# Patient Record
Sex: Female | Born: 1935
Health system: Southern US, Community
[De-identification: ages and names within clinical notes are randomized; demographics above are authoritative.]

## PROBLEM LIST (undated history)

## (undated) DIAGNOSIS — Z8709 Personal history of other diseases of the respiratory system: Secondary | ICD-10-CM

## (undated) DIAGNOSIS — J189 Pneumonia, unspecified organism: Secondary | ICD-10-CM

## (undated) DIAGNOSIS — Z973 Presence of spectacles and contact lenses: Secondary | ICD-10-CM

## (undated) DIAGNOSIS — K219 Gastro-esophageal reflux disease without esophagitis: Secondary | ICD-10-CM

## (undated) DIAGNOSIS — K5900 Constipation, unspecified: Secondary | ICD-10-CM

## (undated) DIAGNOSIS — M1990 Unspecified osteoarthritis, unspecified site: Secondary | ICD-10-CM

## (undated) DIAGNOSIS — Z9289 Personal history of other medical treatment: Secondary | ICD-10-CM

## (undated) DIAGNOSIS — Z87442 Personal history of urinary calculi: Secondary | ICD-10-CM

## (undated) DIAGNOSIS — I10 Essential (primary) hypertension: Secondary | ICD-10-CM

## (undated) DIAGNOSIS — K56609 Unspecified intestinal obstruction, unspecified as to partial versus complete obstruction: Secondary | ICD-10-CM

## (undated) DIAGNOSIS — Z8719 Personal history of other diseases of the digestive system: Secondary | ICD-10-CM

## (undated) HISTORY — PX: ABDOMINAL HYSTERECTOMY: SHX81

## (undated) HISTORY — PX: EYE SURGERY: SHX253

## (undated) HISTORY — PX: APPENDECTOMY: SHX54

## (undated) HISTORY — PX: BREAST SURGERY: SHX581

## (undated) HISTORY — PX: OOPHORECTOMY: SHX86

## (undated) HISTORY — PX: ABDOMINAL SURGERY: SHX537

---

## 1998-01-29 ENCOUNTER — Other Ambulatory Visit: Admission: RE | Admit: 1998-01-29 | Discharge: 1998-01-29 | Payer: Self-pay | Admitting: Obstetrics and Gynecology

## 1998-11-22 ENCOUNTER — Encounter: Payer: Self-pay | Admitting: Family Medicine

## 1998-11-22 ENCOUNTER — Ambulatory Visit (HOSPITAL_COMMUNITY): Admission: RE | Admit: 1998-11-22 | Discharge: 1998-11-22 | Payer: Self-pay | Admitting: Family Medicine

## 1998-11-30 ENCOUNTER — Encounter: Admission: RE | Admit: 1998-11-30 | Discharge: 1998-12-19 | Payer: Self-pay | Admitting: Family Medicine

## 2001-03-26 ENCOUNTER — Other Ambulatory Visit: Admission: RE | Admit: 2001-03-26 | Discharge: 2001-03-26 | Payer: Self-pay | Admitting: Otolaryngology

## 2001-03-26 ENCOUNTER — Encounter (INDEPENDENT_AMBULATORY_CARE_PROVIDER_SITE_OTHER): Payer: Self-pay | Admitting: Specialist

## 2003-05-02 ENCOUNTER — Ambulatory Visit (HOSPITAL_COMMUNITY): Admission: RE | Admit: 2003-05-02 | Discharge: 2003-05-02 | Payer: Self-pay | Admitting: Gastroenterology

## 2003-05-02 ENCOUNTER — Encounter (INDEPENDENT_AMBULATORY_CARE_PROVIDER_SITE_OTHER): Payer: Self-pay

## 2006-06-13 ENCOUNTER — Emergency Department (HOSPITAL_COMMUNITY): Admission: EM | Admit: 2006-06-13 | Discharge: 2006-06-13 | Payer: Self-pay | Admitting: Family Medicine

## 2007-01-09 ENCOUNTER — Emergency Department (HOSPITAL_COMMUNITY): Admission: EM | Admit: 2007-01-09 | Discharge: 2007-01-09 | Payer: Self-pay | Admitting: Emergency Medicine

## 2008-09-13 ENCOUNTER — Encounter: Admission: RE | Admit: 2008-09-13 | Discharge: 2008-09-13 | Payer: Self-pay | Admitting: Gastroenterology

## 2008-09-21 ENCOUNTER — Ambulatory Visit: Admission: RE | Admit: 2008-09-21 | Discharge: 2008-09-21 | Payer: Self-pay | Admitting: Gynecologic Oncology

## 2008-10-25 ENCOUNTER — Ambulatory Visit: Admission: RE | Admit: 2008-10-25 | Discharge: 2008-10-25 | Payer: Self-pay | Admitting: Gynecologic Oncology

## 2009-10-08 ENCOUNTER — Encounter: Admission: RE | Admit: 2009-10-08 | Discharge: 2009-10-08 | Payer: Self-pay | Admitting: Gastroenterology

## 2010-12-03 NOTE — Consult Note (Signed)
[name], [name]                 ACCOUNT NO.:  [id number]   MEDICAL RECORD NO.:  [id number]          PATIENT TYPE:  OUT   LOCATION:  GYN                          FACILITY:  [facility]   PHYSICIAN:  Laurette Schimke, MD     DATE OF BIRTH:  1932/09/22   DATE OF CONSULTATION:  09/21/2008  DATE OF DISCHARGE:                                 CONSULTATION   REASON FOR ADMISSION:  [name] was referred by Dr. Bosie Clos for  evaluation of a pelvic mass.   HISTORY OF PRESENT ILLNESS:  Ms. [name] was in her usual state of  health until one severe episode of nausea approximately 1 week ago.  She  also upon reflection notes an increase in abdominal girth over the last  3 months and she reports changes in her bowel habits over the past 6  months.  A CT of the abdomen and pelvis was obtained because of the  episode of prolonged nausea for 4 days.  Findings were notable for a  pelvic mass measuring 16.2 x 13.8 x 11.0 cm with some mural thickening  in the lower aspect of the mass.  No ascites was appreciated and  otherwise the CT scan was within normal limits.  She reports good  appetite and denies any weight loss.  Markers obtained were CA-125  returned a value of 12, a CEA of 1.3 and a CA 19-9 returned a value of  9.   PAST MEDICAL HISTORY:  1. Hypertension.  2. Nephrolithiasis.  3. Remote history of psoriasis.   PAST SURGICAL HISTORY:  1. Vaginal hysterectomy in 1973 for menorrhagia.  2. Right breast lumpectomy for benign disease in 1986.  3. D and C.   FAMILY HISTORY:  Maternal cousin breast cancer at the age of 59 and  another maternal first cousin had breast cancer at age 16, a third  cousin with breast cancer at the age of 30 and a fourth cousin with  breast cancer at the age of 81, all maternal.   PAST GYNECOLOGICAL HISTORY:  Gravida 3, para 2-1-0-2.  Menarche occurred  at age of 51.  Menopause in the 50s.   SCREENING HISTORY:  Mammography 2009 within normal limits.  Colonoscopy  3 years ago at which time benign polyps were removed.   SOCIAL HISTORY:  She is married.  She is a retired Print production planner.  Tobacco use denies.  Alcohol use denies.  Her mother is over the age of  70 and alive and well.   REVIEW OF SYSTEMS:  Notable for episode of prolonged nausea a few weeks  ago, increasing abdominal girth and changes in her bowel habits.  She  denies any vaginal, rectal or bleeding from the bladder.   PHYSICAL EXAMINATION:  Well-developed, very pleasant thin female in no  acute distress.  Weight 125 pounds, height 5 feet 2-1/2 inches.  Blood  pressure 120/80, pulse 68.  CHEST:  Clear to auscultation.  HEART:  Regular rate and rhythm.  BACK:  No CVA tenderness.  LYMPH NODES:  No cervical, supraclavicular or inguinal adenopathy.  ABDOMEN:  Soft.  Mass palpable with some minimal tenderness in the left  lower quadrant extending to the left of the umbilicus.  PELVIC EXAMINATION:  Normal external genitalia.  Bartholin's ureteral  skeins.  Mass is palpable on vaginal examination.  Smooth, no nodularity  appreciated.  RECTAL EXAMINATION:  No nodularity is noted.  Good anal sphincter tone.  EXTREMITIES:  No clubbing, cyanosis or edema.   IMPRESSION:  This is a 77 year old with a 16.2 cm pelvic mass, normal CA-  125 with reports of increasing abdominal girth, no nausea.   PLAN:  Exploratory laparotomy, bilateral salpingo-oophorectomy with  staging and debulking as indicated.  Risks of procedure discussed with  the patient inclusive of infection, bleeding, damage to surrounding  structures, prolonged hospitalization, reoperation.  This presentation  was discussed with [name] and her daughter and all of their questions  were answered.  It is my expectation that the procedure will be  performed on September 27, 2008 at [facility].      Laurette Schimke, MD  Electronically Signed     WB/MEDQ  D:  09/21/2008  T:  09/21/2008  Job:  [id number]   cc:   Shirley Friar, MD   Fax: [phone]   Telford Nab, R.N.  [phone] [address]  [place], [place] [address]

## 2010-12-03 NOTE — Consult Note (Signed)
NAMESHALAYAH, [name]                 ACCOUNT NO.:  [id number]   MEDICAL RECORD NO.:  [id number]          PATIENT TYPE:  OUT   LOCATION:  GYN                          FACILITY:  [facility]   PHYSICIAN:  Laurette Schimke, MD     DATE OF BIRTH:  [birth year]   DATE OF CONSULTATION:  10/25/2008  DATE OF DISCHARGE:                                 CONSULTATION   REASON FOR VISIT:  Postoperative check.   HISTORY OF PRESENT ILLNESS:  This is a 77 year old who presented with a  history of increasing abdominal girth and a CT scan demonstrating the  presence of a 16-cm mass.  The CEA was 1.3, CA19-9 was 9, and CA125 was  12.  On September 27, 2008, she underwent an exploratory laparotomy,  bilateral salpingo-oophorectomy and appendectomy.  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  The appendix demonstrated  evidence of lymphoid hyperplasia.  [name] had an uneventful  postoperative course.  Today, she presents and states that her appetite  has returned.  She is agreeable to resume her activities of daily  living.  She denies any changes in her bowel habits.  No nausea,  vomiting or abdominal pain.   PHYSICAL EXAMINATION:  Weight 124 pounds.  She is a well-developed  female in no acute distress.  The abdomen is soft and nontender.  The incision is intact.  No palpable  masses or erythema.  Pelvic examination is unremarkable.   IMPRESSION:  [name] is status post bilateral salpingo-oophorectomy  for benign mucinous ovarian mass, normal appendix.  She will continue  her care with Dr. Creola Corn.      Laurette Schimke, MD  Electronically Signed     WB/MEDQ  D:  10/25/2008  T:  10/25/2008  Job:  [id number]   cc:   Gwen Pounds, MD  Fax: [phone]   Shirley Friar, MD  Fax: [phone]   Telford Nab, R.N.  [phone] [address]  [place], [place] [address]

## 2010-12-06 NOTE — Op Note (Signed)
   [name], [name]                           ACCOUNT NO.:  [id number]   MEDICAL RECORD NO.:  [id number]                   PATIENT TYPE:  AMB   LOCATION:  ENDO                                 FACILITY:  [facility]   PHYSICIAN:  Graylin Shiver, M.D.                DATE OF BIRTH:  12/06/1932   DATE OF PROCEDURE:  05/02/2003  DATE OF DISCHARGE:                                 OPERATIVE REPORT   PROCEDURE:  Colonoscopy with polypectomy.   INDICATIONS FOR PROCEDURE:  Screening.   Informed consent was obtained after explanation of the risks of bleeding,  infection and perforation.   PREMEDICATION:  Fentanyl 55 mcg IV, Versed 5 mg IV.   DESCRIPTION OF PROCEDURE:  With the patient in the left lateral decubitus  position, a rectal exam was performed and no masses were felt. The Olympus  pediatric colonoscope was then inserted into the rectum and advanced around  a tortuous sigmoid into the descending colon. It was then advanced around  the colon to the cecum. Cecal landmarks were identified. In the cecum, there  was a small sessile polyp which was snared and removed by snare cautery  technique. In the proximal ascending colon, there were three small polyps,  two of which were snared and removed by snare cautery technique, the other  was very small and just touched with the tip of the hot snare to destroy and  cauterize it. The polyps were suctioned into a specimen container and all  placed in the same specimen container because of the close proximity. The  rest of the ascending colon looked normal. The transverse colon, descending  colon, sigmoid and rectum looked normal. She tolerated the procedure well  without complications.   IMPRESSION:  Several colon polyps as described above in the cecum and  proximal ascending colon. These were all sessile and ranged in size from 3-7  mm.   PLAN:  The pathology will be checked.                                               Graylin Shiver,  M.D.    SFG/MEDQ  D:  05/02/2003  T:  05/02/2003  Job:  [id number]   cc:   Gwen Pounds, M.D.  [address]  [place]  [place] [address]  Fax: [phone]

## 2011-02-04 ENCOUNTER — Other Ambulatory Visit: Payer: Self-pay | Admitting: Internal Medicine

## 2011-02-04 DIAGNOSIS — K7689 Other specified diseases of liver: Secondary | ICD-10-CM

## 2011-02-05 ENCOUNTER — Ambulatory Visit
Admission: RE | Admit: 2011-02-05 | Discharge: 2011-02-05 | Disposition: A | Discharge status: Home / Self Care | Payer: Medicare Other | Source: Ambulatory Visit | Attending: Internal Medicine | Admitting: Internal Medicine

## 2011-02-05 ENCOUNTER — Other Ambulatory Visit: Payer: Self-pay | Admitting: Internal Medicine

## 2011-02-05 DIAGNOSIS — K7689 Other specified diseases of liver: Secondary | ICD-10-CM

## 2011-02-05 MED ORDER — IOHEXOL 300 MG/ML  SOLN
100.0000 mL | Freq: Once | INTRAMUSCULAR | Status: AC | PRN
  Administered 2011-02-05: 100 mL via INTRAVENOUS

## 2011-08-26 DIAGNOSIS — E876 Hypokalemia: Secondary | ICD-10-CM | POA: Diagnosis not present

## 2011-08-26 DIAGNOSIS — K56609 Unspecified intestinal obstruction, unspecified as to partial versus complete obstruction: Secondary | ICD-10-CM | POA: Diagnosis not present

## 2011-08-27 DIAGNOSIS — E876 Hypokalemia: Secondary | ICD-10-CM | POA: Diagnosis not present

## 2011-08-27 DIAGNOSIS — Z86010 Personal history of colonic polyps: Secondary | ICD-10-CM | POA: Diagnosis not present

## 2011-08-27 DIAGNOSIS — Z90710 Acquired absence of both cervix and uterus: Secondary | ICD-10-CM | POA: Diagnosis not present

## 2011-08-27 DIAGNOSIS — I10 Essential (primary) hypertension: Secondary | ICD-10-CM | POA: Diagnosis not present

## 2011-08-27 DIAGNOSIS — K56609 Unspecified intestinal obstruction, unspecified as to partial versus complete obstruction: Secondary | ICD-10-CM | POA: Diagnosis not present

## 2011-08-27 DIAGNOSIS — K565 Intestinal adhesions [bands], unspecified as to partial versus complete obstruction: Secondary | ICD-10-CM | POA: Diagnosis not present

## 2011-08-27 DIAGNOSIS — Z9079 Acquired absence of other genital organ(s): Secondary | ICD-10-CM | POA: Diagnosis not present

## 2011-08-27 DIAGNOSIS — K219 Gastro-esophageal reflux disease without esophagitis: Secondary | ICD-10-CM | POA: Diagnosis present

## 2011-08-29 ENCOUNTER — Telehealth (INDEPENDENT_AMBULATORY_CARE_PROVIDER_SITE_OTHER): Payer: Self-pay

## 2011-08-29 NOTE — Telephone Encounter (Signed)
The daughter called.  She has had surgery by Dr Corliss Skains before.  Her mom was at [facility] with a bowel obstruction and needs surgery.  She wants to come back here and see Dr Corliss Skains soon.  They are going to drop by the notes and disc Monday am for Dr Corliss Skains to review.  She is only on liquids right now.  Please call with a soon appointment.  The patient's cell # is [phone] and daughter Shelda Jakes [phone].

## 2011-09-01 DIAGNOSIS — K5900 Constipation, unspecified: Secondary | ICD-10-CM | POA: Diagnosis not present

## 2011-09-01 DIAGNOSIS — R109 Unspecified abdominal pain: Secondary | ICD-10-CM | POA: Diagnosis not present

## 2011-09-10 DIAGNOSIS — J019 Acute sinusitis, unspecified: Secondary | ICD-10-CM | POA: Diagnosis not present

## 2011-10-02 DIAGNOSIS — N9489 Other specified conditions associated with female genital organs and menstrual cycle: Secondary | ICD-10-CM | POA: Diagnosis not present

## 2011-10-15 DIAGNOSIS — H2510 Age-related nuclear cataract, unspecified eye: Secondary | ICD-10-CM | POA: Diagnosis not present

## 2011-10-23 DIAGNOSIS — I10 Essential (primary) hypertension: Secondary | ICD-10-CM | POA: Diagnosis not present

## 2011-10-23 DIAGNOSIS — E559 Vitamin D deficiency, unspecified: Secondary | ICD-10-CM | POA: Diagnosis not present

## 2011-10-30 DIAGNOSIS — K660 Peritoneal adhesions (postprocedural) (postinfection): Secondary | ICD-10-CM | POA: Diagnosis not present

## 2011-10-30 DIAGNOSIS — I10 Essential (primary) hypertension: Secondary | ICD-10-CM | POA: Diagnosis not present

## 2011-10-30 DIAGNOSIS — Z23 Encounter for immunization: Secondary | ICD-10-CM | POA: Diagnosis not present

## 2011-10-30 DIAGNOSIS — Z0000 Encounter for general adult medical examination without abnormal findings: Secondary | ICD-10-CM | POA: Diagnosis not present

## 2011-10-30 DIAGNOSIS — E876 Hypokalemia: Secondary | ICD-10-CM | POA: Diagnosis not present

## 2011-11-04 DIAGNOSIS — Z1212 Encounter for screening for malignant neoplasm of rectum: Secondary | ICD-10-CM | POA: Diagnosis not present

## 2011-11-07 DIAGNOSIS — Z23 Encounter for immunization: Secondary | ICD-10-CM | POA: Diagnosis not present

## 2011-12-04 DIAGNOSIS — I10 Essential (primary) hypertension: Secondary | ICD-10-CM | POA: Diagnosis not present

## 2011-12-04 DIAGNOSIS — K660 Peritoneal adhesions (postprocedural) (postinfection): Secondary | ICD-10-CM | POA: Diagnosis not present

## 2011-12-04 DIAGNOSIS — R109 Unspecified abdominal pain: Secondary | ICD-10-CM | POA: Diagnosis not present

## 2011-12-04 DIAGNOSIS — Z79899 Other long term (current) drug therapy: Secondary | ICD-10-CM | POA: Diagnosis not present

## 2011-12-05 DIAGNOSIS — K660 Peritoneal adhesions (postprocedural) (postinfection): Secondary | ICD-10-CM | POA: Diagnosis not present

## 2011-12-05 DIAGNOSIS — Z79899 Other long term (current) drug therapy: Secondary | ICD-10-CM | POA: Diagnosis not present

## 2011-12-05 DIAGNOSIS — R109 Unspecified abdominal pain: Secondary | ICD-10-CM | POA: Diagnosis not present

## 2011-12-05 DIAGNOSIS — I10 Essential (primary) hypertension: Secondary | ICD-10-CM | POA: Diagnosis not present

## 2011-12-29 DIAGNOSIS — H43819 Vitreous degeneration, unspecified eye: Secondary | ICD-10-CM | POA: Diagnosis not present

## 2011-12-29 DIAGNOSIS — H25019 Cortical age-related cataract, unspecified eye: Secondary | ICD-10-CM | POA: Diagnosis not present

## 2011-12-29 DIAGNOSIS — H2510 Age-related nuclear cataract, unspecified eye: Secondary | ICD-10-CM | POA: Diagnosis not present

## 2011-12-29 DIAGNOSIS — H52209 Unspecified astigmatism, unspecified eye: Secondary | ICD-10-CM | POA: Diagnosis not present

## 2012-01-28 DIAGNOSIS — IMO0002 Reserved for concepts with insufficient information to code with codable children: Secondary | ICD-10-CM | POA: Diagnosis not present

## 2012-01-28 DIAGNOSIS — H2510 Age-related nuclear cataract, unspecified eye: Secondary | ICD-10-CM | POA: Diagnosis not present

## 2012-01-28 DIAGNOSIS — H25049 Posterior subcapsular polar age-related cataract, unspecified eye: Secondary | ICD-10-CM | POA: Diagnosis not present

## 2012-01-28 DIAGNOSIS — H2589 Other age-related cataract: Secondary | ICD-10-CM | POA: Diagnosis not present

## 2012-02-18 DIAGNOSIS — H25019 Cortical age-related cataract, unspecified eye: Secondary | ICD-10-CM | POA: Diagnosis not present

## 2012-02-18 DIAGNOSIS — IMO0002 Reserved for concepts with insufficient information to code with codable children: Secondary | ICD-10-CM | POA: Diagnosis not present

## 2012-02-18 DIAGNOSIS — H2510 Age-related nuclear cataract, unspecified eye: Secondary | ICD-10-CM | POA: Diagnosis not present

## 2012-04-02 DIAGNOSIS — Z23 Encounter for immunization: Secondary | ICD-10-CM | POA: Diagnosis not present

## 2012-04-30 DIAGNOSIS — R1084 Generalized abdominal pain: Secondary | ICD-10-CM | POA: Diagnosis not present

## 2012-04-30 DIAGNOSIS — M899 Disorder of bone, unspecified: Secondary | ICD-10-CM | POA: Diagnosis not present

## 2012-04-30 DIAGNOSIS — I10 Essential (primary) hypertension: Secondary | ICD-10-CM | POA: Diagnosis not present

## 2012-04-30 DIAGNOSIS — M949 Disorder of cartilage, unspecified: Secondary | ICD-10-CM | POA: Diagnosis not present

## 2012-04-30 DIAGNOSIS — K660 Peritoneal adhesions (postprocedural) (postinfection): Secondary | ICD-10-CM | POA: Diagnosis not present

## 2012-05-10 DIAGNOSIS — Z1231 Encounter for screening mammogram for malignant neoplasm of breast: Secondary | ICD-10-CM | POA: Diagnosis not present

## 2012-05-10 DIAGNOSIS — Z803 Family history of malignant neoplasm of breast: Secondary | ICD-10-CM | POA: Diagnosis not present

## 2012-06-04 DIAGNOSIS — E876 Hypokalemia: Secondary | ICD-10-CM | POA: Diagnosis not present

## 2012-07-16 DIAGNOSIS — E876 Hypokalemia: Secondary | ICD-10-CM | POA: Diagnosis not present

## 2012-08-31 DIAGNOSIS — I10 Essential (primary) hypertension: Secondary | ICD-10-CM | POA: Diagnosis not present

## 2012-10-26 DIAGNOSIS — E559 Vitamin D deficiency, unspecified: Secondary | ICD-10-CM | POA: Diagnosis not present

## 2012-10-26 DIAGNOSIS — I10 Essential (primary) hypertension: Secondary | ICD-10-CM | POA: Diagnosis not present

## 2012-11-09 DIAGNOSIS — I10 Essential (primary) hypertension: Secondary | ICD-10-CM | POA: Diagnosis not present

## 2012-11-09 DIAGNOSIS — F411 Generalized anxiety disorder: Secondary | ICD-10-CM | POA: Diagnosis not present

## 2012-11-09 DIAGNOSIS — Z0000 Encounter for general adult medical examination without abnormal findings: Secondary | ICD-10-CM | POA: Diagnosis not present

## 2012-11-09 DIAGNOSIS — R159 Full incontinence of feces: Secondary | ICD-10-CM | POA: Diagnosis not present

## 2012-11-09 DIAGNOSIS — R1084 Generalized abdominal pain: Secondary | ICD-10-CM | POA: Diagnosis not present

## 2012-11-09 DIAGNOSIS — E876 Hypokalemia: Secondary | ICD-10-CM | POA: Diagnosis not present

## 2012-11-09 DIAGNOSIS — Z1331 Encounter for screening for depression: Secondary | ICD-10-CM | POA: Diagnosis not present

## 2012-11-09 DIAGNOSIS — K660 Peritoneal adhesions (postprocedural) (postinfection): Secondary | ICD-10-CM | POA: Diagnosis not present

## 2012-11-09 DIAGNOSIS — K219 Gastro-esophageal reflux disease without esophagitis: Secondary | ICD-10-CM | POA: Diagnosis not present

## 2012-11-10 DIAGNOSIS — Z1212 Encounter for screening for malignant neoplasm of rectum: Secondary | ICD-10-CM | POA: Diagnosis not present

## 2012-12-28 DIAGNOSIS — E876 Hypokalemia: Secondary | ICD-10-CM | POA: Diagnosis not present

## 2013-01-04 DIAGNOSIS — M949 Disorder of cartilage, unspecified: Secondary | ICD-10-CM | POA: Diagnosis not present

## 2013-03-17 DIAGNOSIS — K5900 Constipation, unspecified: Secondary | ICD-10-CM | POA: Diagnosis not present

## 2013-03-22 DIAGNOSIS — H52209 Unspecified astigmatism, unspecified eye: Secondary | ICD-10-CM | POA: Diagnosis not present

## 2013-03-22 DIAGNOSIS — Z961 Presence of intraocular lens: Secondary | ICD-10-CM | POA: Diagnosis not present

## 2013-04-12 DIAGNOSIS — Z23 Encounter for immunization: Secondary | ICD-10-CM | POA: Diagnosis not present

## 2013-05-02 DIAGNOSIS — I10 Essential (primary) hypertension: Secondary | ICD-10-CM | POA: Diagnosis not present

## 2013-05-02 DIAGNOSIS — M899 Disorder of bone, unspecified: Secondary | ICD-10-CM | POA: Diagnosis not present

## 2013-05-02 DIAGNOSIS — E559 Vitamin D deficiency, unspecified: Secondary | ICD-10-CM | POA: Diagnosis not present

## 2013-05-02 DIAGNOSIS — K589 Irritable bowel syndrome without diarrhea: Secondary | ICD-10-CM | POA: Diagnosis not present

## 2013-05-12 DIAGNOSIS — Z1231 Encounter for screening mammogram for malignant neoplasm of breast: Secondary | ICD-10-CM | POA: Diagnosis not present

## 2013-10-25 DIAGNOSIS — M5030 Other cervical disc degeneration, unspecified cervical region: Secondary | ICD-10-CM | POA: Diagnosis not present

## 2013-10-25 DIAGNOSIS — M542 Cervicalgia: Secondary | ICD-10-CM | POA: Diagnosis not present

## 2013-10-25 DIAGNOSIS — M47812 Spondylosis without myelopathy or radiculopathy, cervical region: Secondary | ICD-10-CM | POA: Diagnosis not present

## 2013-11-03 DIAGNOSIS — M5030 Other cervical disc degeneration, unspecified cervical region: Secondary | ICD-10-CM | POA: Diagnosis not present

## 2013-11-03 DIAGNOSIS — M542 Cervicalgia: Secondary | ICD-10-CM | POA: Diagnosis not present

## 2013-11-07 DIAGNOSIS — M5030 Other cervical disc degeneration, unspecified cervical region: Secondary | ICD-10-CM | POA: Diagnosis not present

## 2013-11-07 DIAGNOSIS — M542 Cervicalgia: Secondary | ICD-10-CM | POA: Diagnosis not present

## 2013-11-10 DIAGNOSIS — R82998 Other abnormal findings in urine: Secondary | ICD-10-CM | POA: Diagnosis not present

## 2013-11-10 DIAGNOSIS — R809 Proteinuria, unspecified: Secondary | ICD-10-CM | POA: Diagnosis not present

## 2013-11-10 DIAGNOSIS — M5030 Other cervical disc degeneration, unspecified cervical region: Secondary | ICD-10-CM | POA: Diagnosis not present

## 2013-11-10 DIAGNOSIS — M542 Cervicalgia: Secondary | ICD-10-CM | POA: Diagnosis not present

## 2013-11-10 DIAGNOSIS — M949 Disorder of cartilage, unspecified: Secondary | ICD-10-CM | POA: Diagnosis not present

## 2013-11-10 DIAGNOSIS — E559 Vitamin D deficiency, unspecified: Secondary | ICD-10-CM | POA: Diagnosis not present

## 2013-11-10 DIAGNOSIS — I10 Essential (primary) hypertension: Secondary | ICD-10-CM | POA: Diagnosis not present

## 2013-11-10 DIAGNOSIS — M899 Disorder of bone, unspecified: Secondary | ICD-10-CM | POA: Diagnosis not present

## 2013-11-14 DIAGNOSIS — M5030 Other cervical disc degeneration, unspecified cervical region: Secondary | ICD-10-CM | POA: Diagnosis not present

## 2013-11-14 DIAGNOSIS — M47812 Spondylosis without myelopathy or radiculopathy, cervical region: Secondary | ICD-10-CM | POA: Diagnosis not present

## 2013-11-14 DIAGNOSIS — M542 Cervicalgia: Secondary | ICD-10-CM | POA: Diagnosis not present

## 2013-11-15 DIAGNOSIS — M5030 Other cervical disc degeneration, unspecified cervical region: Secondary | ICD-10-CM | POA: Diagnosis not present

## 2013-11-15 DIAGNOSIS — M542 Cervicalgia: Secondary | ICD-10-CM | POA: Diagnosis not present

## 2013-11-17 DIAGNOSIS — Z0000 Encounter for general adult medical examination without abnormal findings: Secondary | ICD-10-CM | POA: Diagnosis not present

## 2013-11-17 DIAGNOSIS — K219 Gastro-esophageal reflux disease without esophagitis: Secondary | ICD-10-CM | POA: Diagnosis not present

## 2013-11-17 DIAGNOSIS — R7309 Other abnormal glucose: Secondary | ICD-10-CM | POA: Diagnosis not present

## 2013-11-17 DIAGNOSIS — E781 Pure hyperglyceridemia: Secondary | ICD-10-CM | POA: Diagnosis not present

## 2013-11-17 DIAGNOSIS — K660 Peritoneal adhesions (postprocedural) (postinfection): Secondary | ICD-10-CM | POA: Diagnosis not present

## 2013-11-17 DIAGNOSIS — M542 Cervicalgia: Secondary | ICD-10-CM | POA: Diagnosis not present

## 2013-11-17 DIAGNOSIS — I10 Essential (primary) hypertension: Secondary | ICD-10-CM | POA: Diagnosis not present

## 2013-11-17 DIAGNOSIS — M5030 Other cervical disc degeneration, unspecified cervical region: Secondary | ICD-10-CM | POA: Diagnosis not present

## 2013-11-17 DIAGNOSIS — E559 Vitamin D deficiency, unspecified: Secondary | ICD-10-CM | POA: Diagnosis not present

## 2013-11-18 DIAGNOSIS — Z1212 Encounter for screening for malignant neoplasm of rectum: Secondary | ICD-10-CM | POA: Diagnosis not present

## 2013-11-22 DIAGNOSIS — M542 Cervicalgia: Secondary | ICD-10-CM | POA: Diagnosis not present

## 2013-11-22 DIAGNOSIS — M5030 Other cervical disc degeneration, unspecified cervical region: Secondary | ICD-10-CM | POA: Diagnosis not present

## 2013-11-24 DIAGNOSIS — M5030 Other cervical disc degeneration, unspecified cervical region: Secondary | ICD-10-CM | POA: Diagnosis not present

## 2013-11-24 DIAGNOSIS — M542 Cervicalgia: Secondary | ICD-10-CM | POA: Diagnosis not present

## 2013-11-28 DIAGNOSIS — M542 Cervicalgia: Secondary | ICD-10-CM | POA: Diagnosis not present

## 2013-11-28 DIAGNOSIS — M5030 Other cervical disc degeneration, unspecified cervical region: Secondary | ICD-10-CM | POA: Diagnosis not present

## 2013-11-30 DIAGNOSIS — M5030 Other cervical disc degeneration, unspecified cervical region: Secondary | ICD-10-CM | POA: Diagnosis not present

## 2013-11-30 DIAGNOSIS — M542 Cervicalgia: Secondary | ICD-10-CM | POA: Diagnosis not present

## 2013-12-03 DIAGNOSIS — M47812 Spondylosis without myelopathy or radiculopathy, cervical region: Secondary | ICD-10-CM | POA: Diagnosis not present

## 2013-12-07 DIAGNOSIS — M47812 Spondylosis without myelopathy or radiculopathy, cervical region: Secondary | ICD-10-CM | POA: Diagnosis not present

## 2013-12-14 DIAGNOSIS — M4800 Spinal stenosis, site unspecified: Secondary | ICD-10-CM | POA: Diagnosis not present

## 2013-12-14 DIAGNOSIS — M47812 Spondylosis without myelopathy or radiculopathy, cervical region: Secondary | ICD-10-CM | POA: Diagnosis not present

## 2013-12-14 DIAGNOSIS — IMO0001 Reserved for inherently not codable concepts without codable children: Secondary | ICD-10-CM | POA: Diagnosis not present

## 2013-12-26 DIAGNOSIS — M47812 Spondylosis without myelopathy or radiculopathy, cervical region: Secondary | ICD-10-CM | POA: Diagnosis not present

## 2013-12-26 DIAGNOSIS — M542 Cervicalgia: Secondary | ICD-10-CM | POA: Diagnosis not present

## 2014-01-23 DIAGNOSIS — M4800 Spinal stenosis, site unspecified: Secondary | ICD-10-CM | POA: Diagnosis not present

## 2014-01-23 DIAGNOSIS — M47812 Spondylosis without myelopathy or radiculopathy, cervical region: Secondary | ICD-10-CM | POA: Diagnosis not present

## 2014-01-23 DIAGNOSIS — IMO0001 Reserved for inherently not codable concepts without codable children: Secondary | ICD-10-CM | POA: Diagnosis not present

## 2014-04-11 DIAGNOSIS — Z23 Encounter for immunization: Secondary | ICD-10-CM | POA: Diagnosis not present

## 2014-04-16 DIAGNOSIS — R5383 Other fatigue: Secondary | ICD-10-CM | POA: Diagnosis not present

## 2014-04-16 DIAGNOSIS — J069 Acute upper respiratory infection, unspecified: Secondary | ICD-10-CM | POA: Diagnosis not present

## 2014-04-16 DIAGNOSIS — R5381 Other malaise: Secondary | ICD-10-CM | POA: Diagnosis not present

## 2014-04-16 DIAGNOSIS — R0602 Shortness of breath: Secondary | ICD-10-CM | POA: Diagnosis not present

## 2014-05-05 DIAGNOSIS — K5900 Constipation, unspecified: Secondary | ICD-10-CM | POA: Diagnosis not present

## 2014-05-05 DIAGNOSIS — R12 Heartburn: Secondary | ICD-10-CM | POA: Diagnosis not present

## 2014-05-09 ENCOUNTER — Other Ambulatory Visit: Payer: Self-pay | Admitting: Gastroenterology

## 2014-05-09 DIAGNOSIS — K5900 Constipation, unspecified: Secondary | ICD-10-CM

## 2014-05-16 ENCOUNTER — Ambulatory Visit
Admission: RE | Admit: 2014-05-16 | Discharge: 2014-05-16 | Disposition: A | Discharge status: Home / Self Care | Payer: Medicare Other | Source: Ambulatory Visit | Attending: Gastroenterology | Admitting: Gastroenterology

## 2014-05-16 DIAGNOSIS — K5900 Constipation, unspecified: Secondary | ICD-10-CM

## 2014-05-16 DIAGNOSIS — K5730 Diverticulosis of large intestine without perforation or abscess without bleeding: Secondary | ICD-10-CM | POA: Diagnosis not present

## 2014-05-16 DIAGNOSIS — Z86010 Personal history of colonic polyps: Secondary | ICD-10-CM | POA: Diagnosis not present

## 2014-05-16 DIAGNOSIS — Z539 Procedure and treatment not carried out, unspecified reason: Secondary | ICD-10-CM | POA: Diagnosis not present

## 2014-05-18 DIAGNOSIS — Z1231 Encounter for screening mammogram for malignant neoplasm of breast: Secondary | ICD-10-CM | POA: Diagnosis not present

## 2014-05-23 DIAGNOSIS — N6002 Solitary cyst of left breast: Secondary | ICD-10-CM | POA: Diagnosis not present

## 2014-08-01 DIAGNOSIS — Z961 Presence of intraocular lens: Secondary | ICD-10-CM | POA: Diagnosis not present

## 2014-11-13 DIAGNOSIS — R739 Hyperglycemia, unspecified: Secondary | ICD-10-CM | POA: Diagnosis not present

## 2014-11-13 DIAGNOSIS — Z0000 Encounter for general adult medical examination without abnormal findings: Secondary | ICD-10-CM | POA: Diagnosis not present

## 2014-11-13 DIAGNOSIS — I10 Essential (primary) hypertension: Secondary | ICD-10-CM | POA: Diagnosis not present

## 2014-11-13 DIAGNOSIS — M859 Disorder of bone density and structure, unspecified: Secondary | ICD-10-CM | POA: Diagnosis not present

## 2014-11-13 DIAGNOSIS — E781 Pure hyperglyceridemia: Secondary | ICD-10-CM | POA: Diagnosis not present

## 2014-11-13 DIAGNOSIS — N390 Urinary tract infection, site not specified: Secondary | ICD-10-CM | POA: Diagnosis not present

## 2014-11-20 DIAGNOSIS — R159 Full incontinence of feces: Secondary | ICD-10-CM | POA: Diagnosis not present

## 2014-11-20 DIAGNOSIS — M5030 Other cervical disc degeneration, unspecified cervical region: Secondary | ICD-10-CM | POA: Diagnosis not present

## 2014-11-20 DIAGNOSIS — Z6823 Body mass index (BMI) 23.0-23.9, adult: Secondary | ICD-10-CM | POA: Diagnosis not present

## 2014-11-20 DIAGNOSIS — K660 Peritoneal adhesions (postprocedural) (postinfection): Secondary | ICD-10-CM | POA: Diagnosis not present

## 2014-11-20 DIAGNOSIS — E781 Pure hyperglyceridemia: Secondary | ICD-10-CM | POA: Diagnosis not present

## 2014-11-20 DIAGNOSIS — R739 Hyperglycemia, unspecified: Secondary | ICD-10-CM | POA: Diagnosis not present

## 2014-11-20 DIAGNOSIS — Z23 Encounter for immunization: Secondary | ICD-10-CM | POA: Diagnosis not present

## 2014-11-20 DIAGNOSIS — K7689 Other specified diseases of liver: Secondary | ICD-10-CM | POA: Diagnosis not present

## 2014-11-20 DIAGNOSIS — Z1389 Encounter for screening for other disorder: Secondary | ICD-10-CM | POA: Diagnosis not present

## 2014-11-20 DIAGNOSIS — Z0000 Encounter for general adult medical examination without abnormal findings: Secondary | ICD-10-CM | POA: Diagnosis not present

## 2014-11-20 DIAGNOSIS — J449 Chronic obstructive pulmonary disease, unspecified: Secondary | ICD-10-CM | POA: Diagnosis not present

## 2014-11-20 DIAGNOSIS — K760 Fatty (change of) liver, not elsewhere classified: Secondary | ICD-10-CM | POA: Diagnosis not present

## 2014-11-21 DIAGNOSIS — Z1212 Encounter for screening for malignant neoplasm of rectum: Secondary | ICD-10-CM | POA: Diagnosis not present

## 2015-04-05 DIAGNOSIS — Z23 Encounter for immunization: Secondary | ICD-10-CM | POA: Diagnosis not present

## 2015-06-04 DIAGNOSIS — Z1231 Encounter for screening mammogram for malignant neoplasm of breast: Secondary | ICD-10-CM | POA: Diagnosis not present

## 2015-06-04 DIAGNOSIS — Z803 Family history of malignant neoplasm of breast: Secondary | ICD-10-CM | POA: Diagnosis not present

## 2015-07-04 IMAGING — CR DG BE W/ AIR HIGH DENSITY
12 of 14 series · 15 of 19 positions shown · non-contrast
Comparison: CT abdomen pelvis 02/07/2011

CLINICAL DATA: Incomplete colonoscopy.  History of colon polyps.

EXAM:
AIR CONTRAST BARIUM ENEMA
TECHNIQUE: Initial scout AP supine abdominal image obtained to insure adequate
colon cleansing. Barium was introduced into the colon in a
retrograde fashion and refluxed from the rectum to the distal
transverse colon. As much of the barium as possible was then removed
through the indwelling tube via gravity drain. Air was then
insufflated into the colon. Spot images of the colon followed by
overhead radiographs were obtained.
FLUOROSCOPY TIME:  3 min 24 seconds

[Series 1: run · 2 of 2 slices shown (1 of 5)]
[im 1/2]
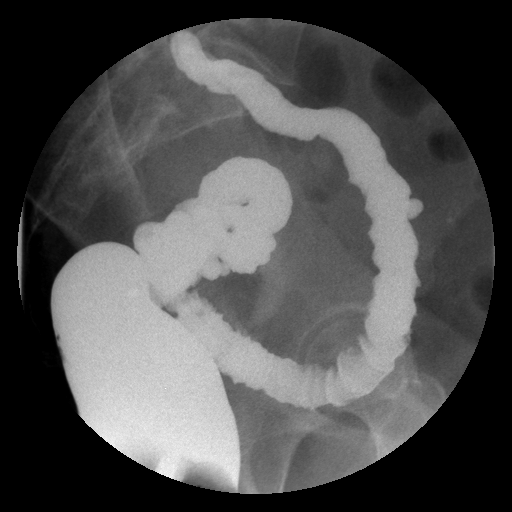
[im 2/2]
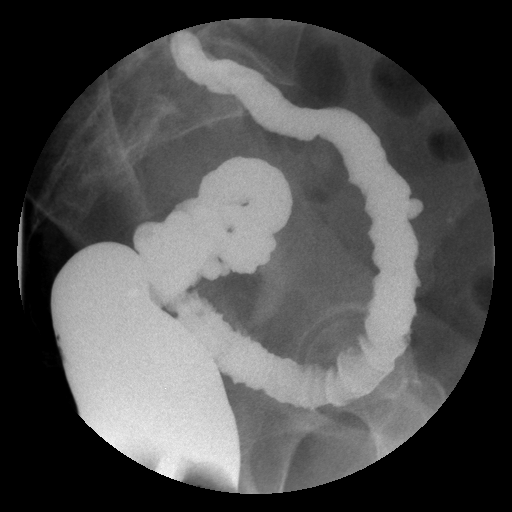

[run (2 of 5)]
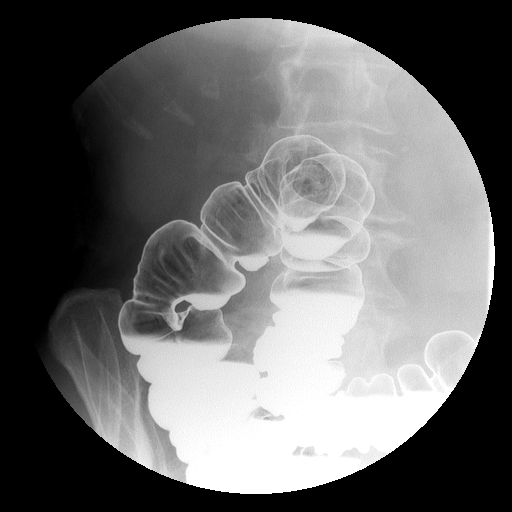

[Series 3: run · 2 of 2 slices shown (3 of 5)]
[im 1/2]
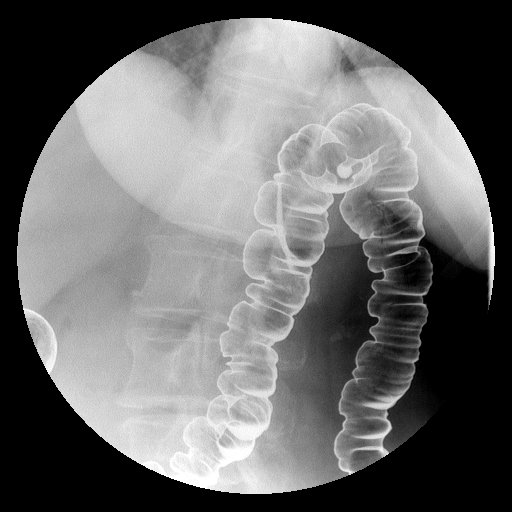
[im 2/2]
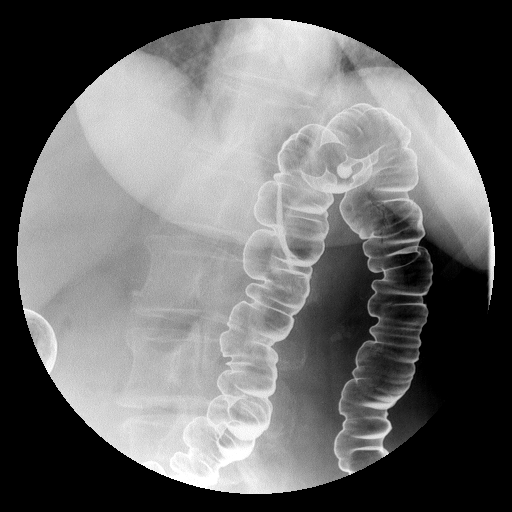

[run (4 of 5)]
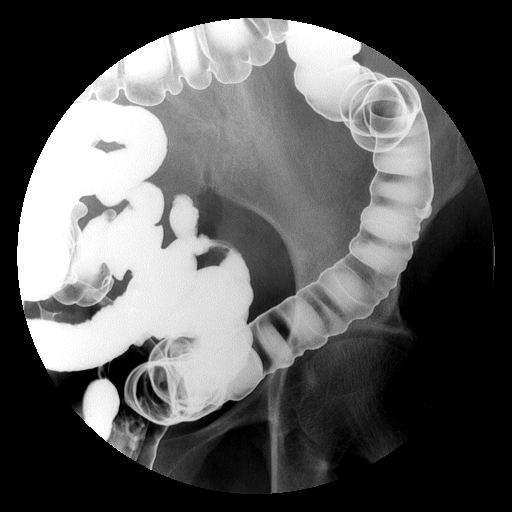

[Series 5: run · 2 of 2 slices shown (5 of 5)]
[im 1/2]
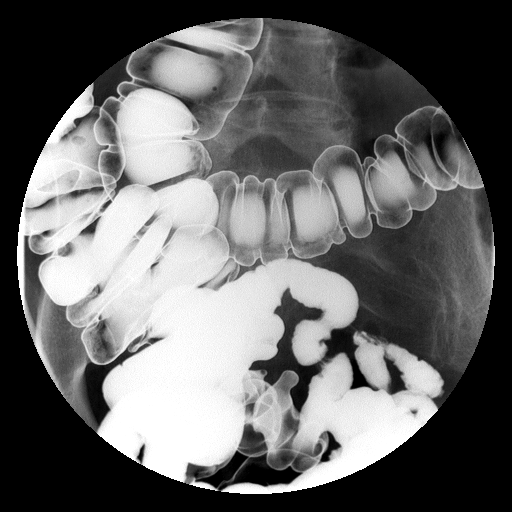
[im 2/2]
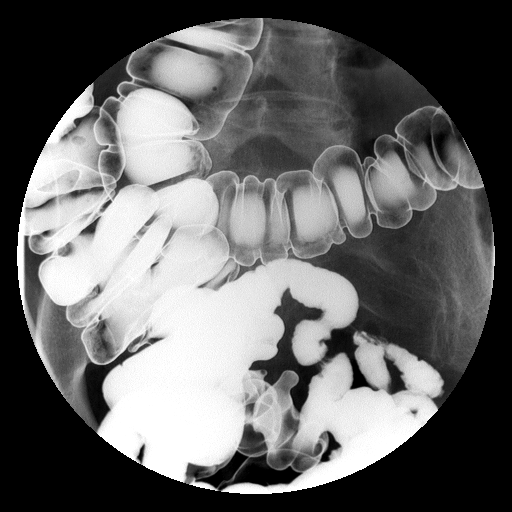

[view not recorded (1 of 7)]
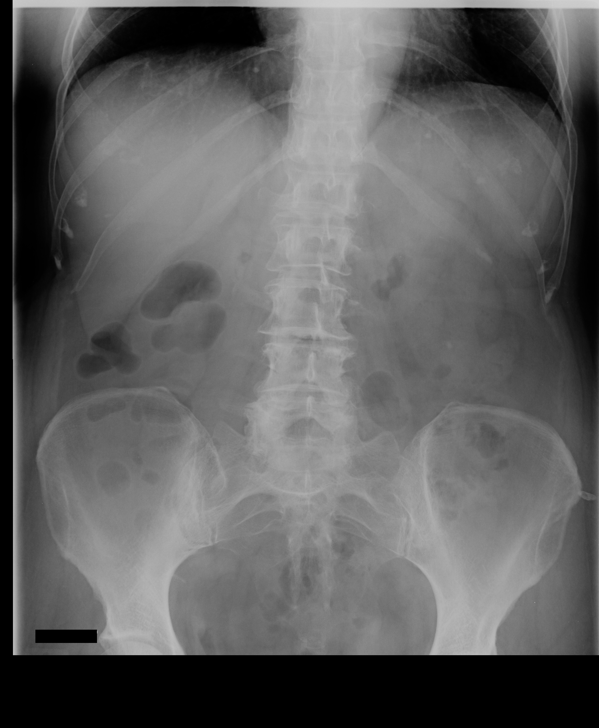

[view not recorded (2 of 7)]
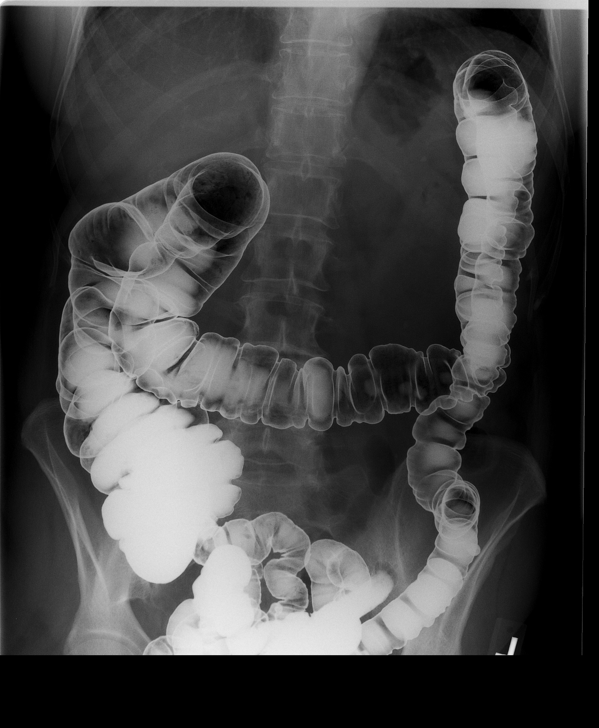

[view not recorded (3 of 7)]
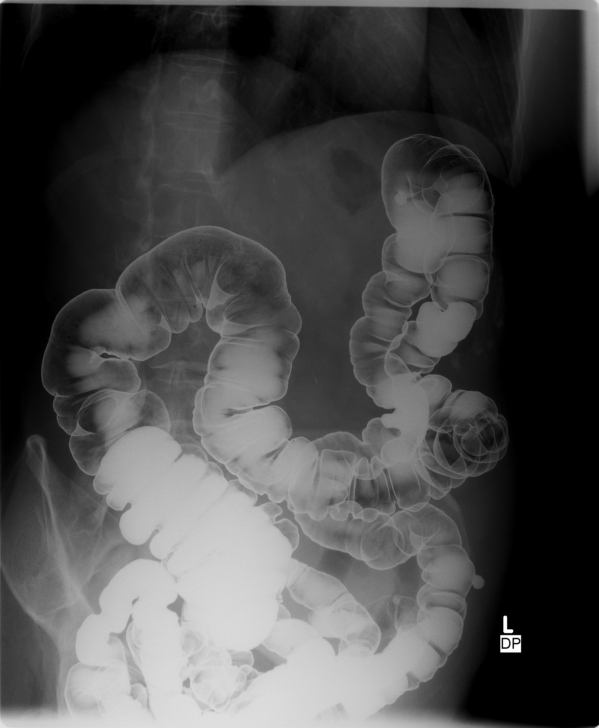

[view not recorded (4 of 7)]
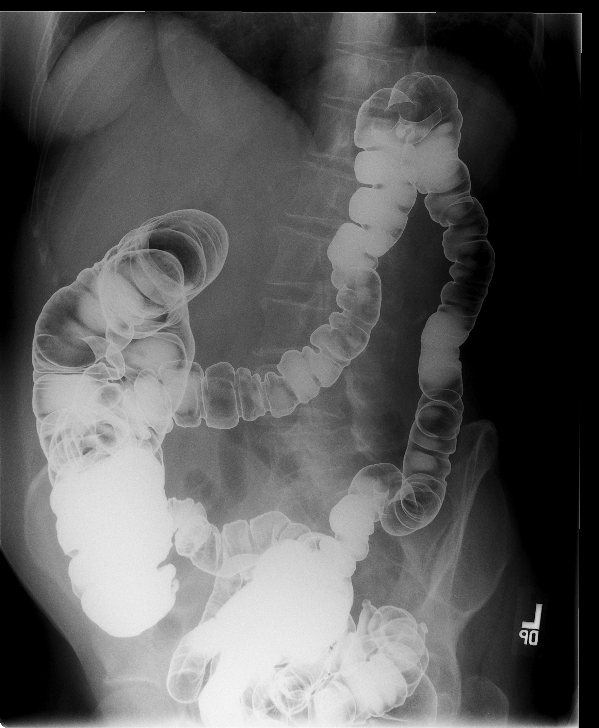

[view not recorded (5 of 7)]
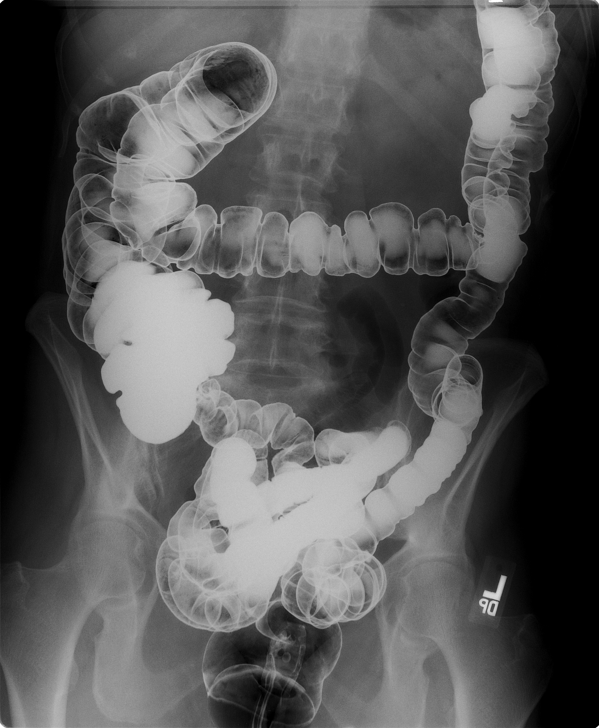

[view not recorded (6 of 7)]
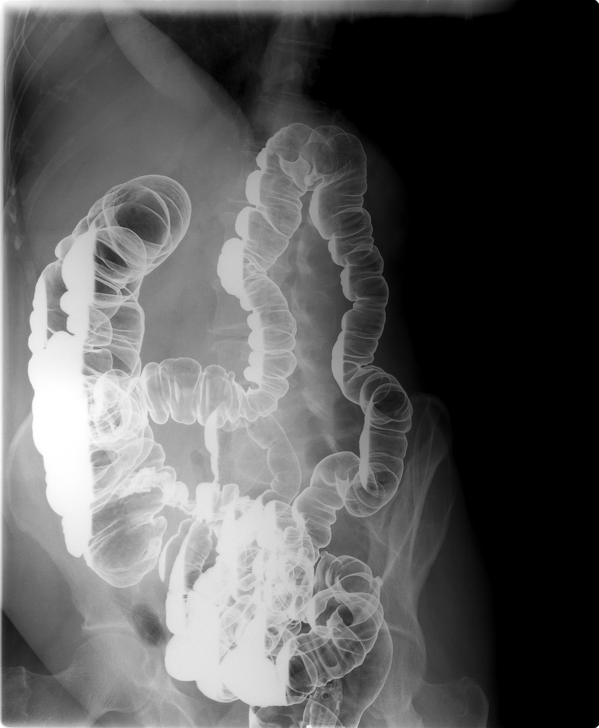

[view not recorded (7 of 7)]
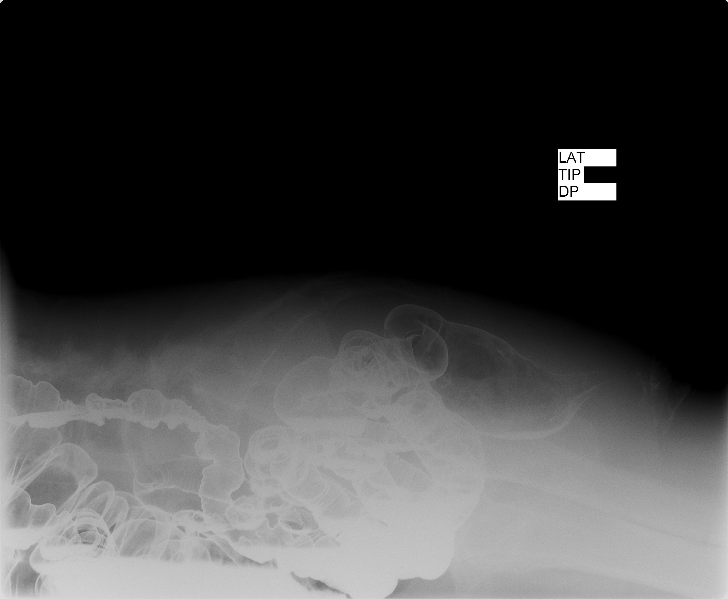

[15 of 19 positions shown; findings below may reference images not displayed]

FINDINGS: Preliminary KUB is negative. Lumbar degenerative change incidentally
noted.

Barium was refluxed into the cecum and terminal ileum via rectal
catheter. Air was used to distend the colon.

Mild diverticulosis. Scattered tics in the left and sigmoid colon.
No evidence of diverticulitis.

Negative for obstruction or mass. No polyp identified. Appendiceal
stump noted.
IMPRESSION: Mild diverticulosis.  Negative for mass or polyp.

## 2015-07-25 ENCOUNTER — Encounter (HOSPITAL_COMMUNITY): Payer: Self-pay | Admitting: Cardiology

## 2015-07-25 ENCOUNTER — Emergency Department (HOSPITAL_COMMUNITY)
Admission: EM | Admit: 2015-07-25 | Discharge: 2015-07-25 | Disposition: A | Discharge status: Home / Self Care | Payer: Medicare Other | Attending: Emergency Medicine | Admitting: Emergency Medicine

## 2015-07-25 ENCOUNTER — Emergency Department (HOSPITAL_COMMUNITY): Payer: Medicare Other

## 2015-07-25 DIAGNOSIS — K7689 Other specified diseases of liver: Secondary | ICD-10-CM | POA: Insufficient documentation

## 2015-07-25 DIAGNOSIS — R110 Nausea: Secondary | ICD-10-CM | POA: Insufficient documentation

## 2015-07-25 DIAGNOSIS — R39198 Other difficulties with micturition: Secondary | ICD-10-CM | POA: Insufficient documentation

## 2015-07-25 DIAGNOSIS — K921 Melena: Secondary | ICD-10-CM | POA: Diagnosis not present

## 2015-07-25 DIAGNOSIS — R1011 Right upper quadrant pain: Secondary | ICD-10-CM | POA: Diagnosis present

## 2015-07-25 DIAGNOSIS — K5900 Constipation, unspecified: Secondary | ICD-10-CM | POA: Diagnosis not present

## 2015-07-25 DIAGNOSIS — K835 Biliary cyst: Secondary | ICD-10-CM | POA: Diagnosis not present

## 2015-07-25 DIAGNOSIS — I10 Essential (primary) hypertension: Secondary | ICD-10-CM | POA: Insufficient documentation

## 2015-07-25 DIAGNOSIS — R509 Fever, unspecified: Secondary | ICD-10-CM | POA: Insufficient documentation

## 2015-07-25 HISTORY — DX: Gastro-esophageal reflux disease without esophagitis: K21.9

## 2015-07-25 HISTORY — DX: Unspecified intestinal obstruction, unspecified as to partial versus complete obstruction: K56.609

## 2015-07-25 HISTORY — DX: Essential (primary) hypertension: I10

## 2015-07-25 LAB — COMPREHENSIVE METABOLIC PANEL
ALBUMIN: 3.5 g/dL (ref 3.5–5.0)
ALT: 21 U/L (ref 14–54)
ANION GAP: 10 (ref 5–15)
AST: 20 U/L (ref 15–41)
Alkaline Phosphatase: 61 U/L (ref 38–126)
BUN: 15 mg/dL (ref 6–20)
CO2: 27 mmol/L (ref 22–32)
Calcium: 8.3 mg/dL — ABNORMAL LOW (ref 8.9–10.3)
Chloride: 103 mmol/L (ref 101–111)
Creatinine, Ser: 0.76 mg/dL (ref 0.44–1.00)
GFR calc Af Amer: >60 mL/min (ref >60)
GFR calc non Af Amer: >60 mL/min (ref >60)
GLUCOSE: 117 mg/dL — AB (ref 65–99)
POTASSIUM: 3.2 mmol/L — AB (ref 3.5–5.1)
SODIUM: 140 mmol/L (ref 135–145)
Total Bilirubin: 0.4 mg/dL (ref 0.3–1.2)
Total Protein: 6.4 g/dL — ABNORMAL LOW (ref 6.5–8.1)

## 2015-07-25 LAB — URINE MICROSCOPIC-ADD ON

## 2015-07-25 LAB — CBC WITH DIFFERENTIAL/PLATELET
BASOS PCT: 0 %
Basophils Absolute: 0 10*3/uL (ref 0.0–0.1)
EOS ABS: 0.1 10*3/uL (ref 0.0–0.7)
EOS PCT: 1 %
HCT: 40.7 % (ref 36.0–46.0)
Hemoglobin: 13.9 g/dL (ref 12.0–15.0)
Lymphocytes Relative: 20 %
Lymphs Abs: 1.5 10*3/uL (ref 0.7–4.0)
MCH: 29.3 pg (ref 26.0–34.0)
MCHC: 34.2 g/dL (ref 30.0–36.0)
MCV: 85.7 fL (ref 78.0–100.0)
MONO ABS: 0.8 10*3/uL (ref 0.1–1.0)
MONOS PCT: 11 %
NEUTROS PCT: 68 %
Neutro Abs: 5.2 10*3/uL (ref 1.7–7.7)
PLATELETS: 180 10*3/uL (ref 150–400)
RBC: 4.75 MIL/uL (ref 3.87–5.11)
RDW: 13.9 % (ref 11.5–15.5)
WBC: 7.6 10*3/uL (ref 4.0–10.5)

## 2015-07-25 LAB — URINALYSIS, ROUTINE W REFLEX MICROSCOPIC
Bilirubin Urine: NEGATIVE
GLUCOSE, UA: NEGATIVE mg/dL
Hgb urine dipstick: NEGATIVE
Ketones, ur: NEGATIVE mg/dL
Nitrite: NEGATIVE
PROTEIN: NEGATIVE mg/dL
Specific Gravity, Urine: 1.015 (ref 1.005–1.030)
pH: 7 (ref 5.0–8.0)

## 2015-07-25 LAB — POC OCCULT BLOOD, ED: FECAL OCCULT BLD: NEGATIVE

## 2015-07-25 LAB — LIPASE, BLOOD: LIPASE: 37 U/L (ref 11–51)

## 2015-07-25 MED ORDER — ONDANSETRON HCL 4 MG/2ML IJ SOLN
4.0000 mg | Freq: Once | INTRAMUSCULAR | Status: AC
  Administered 2015-07-25: 4 mg via INTRAVENOUS
  Filled 2015-07-25: qty 2

## 2015-07-25 MED ORDER — IOHEXOL 300 MG/ML  SOLN
100.0000 mL | Freq: Once | INTRAMUSCULAR | Status: AC | PRN
  Administered 2015-07-25: 100 mL via INTRAVENOUS

## 2015-07-25 MED ORDER — PROMETHAZINE HCL 25 MG/ML IJ SOLN
12.5000 mg | Freq: Once | INTRAMUSCULAR | Status: AC
  Administered 2015-07-25: 12.5 mg via INTRAVENOUS
  Filled 2015-07-25: qty 1

## 2015-07-25 MED ORDER — MORPHINE SULFATE (PF) 4 MG/ML IV SOLN
4.0000 mg | Freq: Once | INTRAVENOUS | Status: DC | PRN
  Filled 2015-07-25: qty 1

## 2015-07-25 MED ORDER — SODIUM CHLORIDE 0.9 % IV BOLUS (SEPSIS)
1000.0000 mL | Freq: Once | INTRAVENOUS | Status: AC
  Administered 2015-07-25: 1000 mL via INTRAVENOUS

## 2015-07-25 MED ORDER — PROMETHAZINE HCL 12.5 MG PO TABS: 12.5000 mg | ORAL_TABLET | Freq: Four times a day (QID) | ORAL | Status: AC | PRN

## 2015-07-25 MED ORDER — IOHEXOL 300 MG/ML  SOLN
25.0000 mL | Freq: Once | INTRAMUSCULAR | Status: AC | PRN
  Administered 2015-07-25: 25 mL via ORAL

## 2015-07-25 NOTE — ED Provider Notes (Signed)
Medical screening examination/treatment/procedure(s) were conducted as a shared visit with non-physician practitioner(s) and myself.  I personally evaluated the patient during the encounter.   EKG Interpretation None     82 y.o. female presents with RUQ pain, constipation, prior h/o bowel obstruction. No acute distress on exam. Plan CT and labs. Area of pain is consistent with enlarging hepatic cyst. We'll recommend outpatient evaluation by general surgery.  See related encounter note   Leo Grosser, MD 07/26/15 [phone]

## 2015-07-25 NOTE — ED Notes (Signed)
Reports she has not been feeling well over the past couple of days. States she has hx of bowel problems and has had surgery in the past. Is still having bowel movements, but has been nauseated.

## 2015-07-25 NOTE — ED Notes (Signed)
Pt ambulated to restroom. 

## 2015-07-25 NOTE — Discharge Instructions (Signed)
You have been seen today for abdominal pain. It is noted that you have a hepatic (liver) cyst that has grown to 8.0 x 6.9 cm (previously 6.6 x 5.4 cm). You will need to follow-up with surgery on an outpatient nonemergent basis. Follow up with PCP as needed. Return to ED should symptoms worsen. May take ibuprofen for pain.

## 2015-07-25 NOTE — ED Provider Notes (Signed)
CSN: [id number]     Arrival date & time 07/25/15  0758 History   First MD Initiated Contact with Patient 07/25/15 0818     Chief Complaint  Patient presents with  . Abdominal Pain     (Consider location/radiation/quality/duration/timing/severity/associated sxs/prior Treatment) HPI   [name] is a 82 y.o. female, with a history of bowel obstruction, multiple abdominal surgeries, hypertension, and GERD, presenting to the ED with RUQ abdominal pain that began about three days ago. Pt also complains of constipation and nausea. Last BM was 2 days ago. Pt states, "It feels exactly like it did when I had my bowel obstruction before." Pt adds that she usually has difficulty having a BM, will have a daily BM, but it takes multiple trips to the bathroom to actually clear her bowels. Pt also endorses some fecal incontinence over the last couple weeks. Pt states this is liquid or bits of stool. Pt endorses subjective fever, with Tmax of 38F. Pt rates the pain 10/10 but then subsides, describes it as a dull ache, non-radiating. Pt denies chest pain, shortness of breath, diarrhea, vomiting, or any other complaints.     Past Medical History  Diagnosis Date  . Bowel obstruction ([place])   . Hypertension   . GERD (gastroesophageal reflux disease)    Past Surgical History  Procedure Laterality Date  . Abdominal surgery    . Abdominal hysterectomy    . Breast surgery    . Appendectomy    . Oophorectomy     History reviewed. No pertinent family history. Social History  Substance Use Topics  . Smoking status: Never Smoker   . Smokeless tobacco: None  . Alcohol Use: No   OB History    No data available     Review of Systems  Constitutional: Positive for fever. Negative for chills and diaphoresis.  Respiratory: Negative for shortness of breath.   Cardiovascular: Negative for chest pain.  Gastrointestinal: Positive for nausea, abdominal pain, constipation and blood in stool (Possibly). Negative  for vomiting.  Genitourinary: Positive for difficulty urinating. Negative for dysuria, hematuria, vaginal bleeding and vaginal discharge.  Neurological: Negative for dizziness and light-headedness.  All other systems reviewed and are negative.     Allergies  Keflex  Home Medications   Prior to Admission medications   Not on File   BP 123/59 mmHg  Pulse 74  Temp(Src) 98.1 F (36.7 C) (Oral)  Resp 15  SpO2 96% Physical Exam  Constitutional: She appears well-developed and well-nourished. No distress.  HENT:  Head: Normocephalic and atraumatic.  Eyes: Conjunctivae are normal. Pupils are equal, round, and reactive to light.  Cardiovascular: Normal rate, regular rhythm and normal heart sounds.   Pulmonary/Chest: Effort normal and breath sounds normal. No respiratory distress.  Abdominal: Soft. Normal appearance. Bowel sounds are decreased. There is tenderness in the right upper quadrant. There is no CVA tenderness.  Generalized abdominal tenderness but with the greatest pain in the right upper quadrant. No discernible hernia.  Genitourinary: Rectum normal. Rectal exam shows no mass. Guaiac negative stool.  RN, Ria Comment, served as chaperone during the rectal exam.  Musculoskeletal: She exhibits no edema or tenderness.  Neurological: She is alert.  Skin: Skin is warm and dry. She is not diaphoretic.  Nursing note and vitals reviewed.   ED Course  Procedures (including critical care time) Labs Review Labs Reviewed  COMPREHENSIVE METABOLIC PANEL - Abnormal; Notable for the following:    Potassium 3.2 (*)    Glucose, Bld 117 (*)  Calcium 8.3 (*)    Total Protein 6.4 (*)    All other components within normal limits  URINALYSIS, ROUTINE W REFLEX MICROSCOPIC (NOT AT [facility]) - Abnormal; Notable for the following:    APPearance CLOUDY (*)    Leukocytes, UA MODERATE (*)    All other components within normal limits  URINE MICROSCOPIC-ADD ON - Abnormal; Notable for the following:     Squamous Epithelial / LPF 0-5 (*)    Bacteria, UA FEW (*)    All other components within normal limits  CBC WITH DIFFERENTIAL/PLATELET  LIPASE, BLOOD  POC OCCULT BLOOD, ED    Imaging Review Ct Abdomen Pelvis W Contrast  07/25/2015  CLINICAL DATA:  Right upper quadrant abdominal pain with nausea beginning last night. Prior bowel obstruction. EXAM: CT ABDOMEN AND PELVIS WITH CONTRAST TECHNIQUE: Multidetector CT imaging of the abdomen and pelvis was performed using the standard protocol following bolus administration of intravenous contrast. CONTRAST:  153mL OMNIPAQUE IOHEXOL 300 MG/ML  SOLN COMPARISON:  CT abdomen 02/05/2011. CT abdomen and pelvis 10/08/2009. FINDINGS: Minimal atelectasis is noted in the lung bases. Decreased attenuation of the liver is consistent with steatosis. Left hepatic lobe lateral segment cyst has enlarged, now measuring 8.0 x 6.9 cm (previously 6.6 x 5.4 cm). The gallbladder, spleen, adrenal glands, and pancreas are unremarkable. There are bilateral extrarenal pelves. Subcentimeter low-density lesions in both kidneys are too small to fully characterize but most likely represent cysts. A 3 mm nonobstructing calculus in the lower pole of the left kidney is unchanged. There is a small sliding hiatal hernia. Oral contrast is present in nondilated loops of distal small bowel and colon without evidence of obstruction. The appendix is unremarkable. The appendix is absent. The bladder is unremarkable. The uterus is absent. Mild abdominal aortic atherosclerosis without aneurysm. No free fluid or enlarged lymph nodes are identified. Lumbar disc degeneration is greatest at L3-4. There is moderate to severe lower lumbar facet arthrosis. IMPRESSION: 1. No acute abnormality identified in the abdomen or pelvis. 2. Nonobstructing left renal calculus. 3. Enlargement of hepatic cyst. Electronically Signed   By: Logan Bores M.D.   On: 07/25/2015 10:41   I have personally reviewed and evaluated these  images and lab results as part of my medical decision-making.   EKG Interpretation None      MDM   Final diagnoses:  Right upper quadrant pain  Nausea  Hepatic cyst    [name] presents with right upper quadrant abdominal pain for the last 3 days.  Findings and plan of care discussed with Leo Grosser, MD.  This patient's presentation is suspicious for bowel obstruction versus cholecystitis. Patient was offered pain management, but declined. Nausea was controlled with Phenergan after Zofran failed. CT shows an increase in the patient's hepatic cyst from 6.65.4 cm previously to 8.06.9 cm today. Thus, patient has an enlarging, symptomatic hepatic cyst. Patient referred to general surgery for outpatient consult. This plan of care as well as the CT findings were communicated with the patient. Patient was given home care instructions and return precautions. Patient voiced understanding of these instructions, accepted the plan, and is comfortable with discharge.  Filed Vitals:   07/25/15 0806 07/25/15 0900 07/25/15 0930 07/25/15 1033  BP: 140/75 129/63 122/86 123/59  Pulse: 84 64 72 74  Temp: 98.1 F (36.7 C)     TempSrc: Oral     Resp: 17   15  SpO2: 95% 94% 93% 96%     Shawn C Joy, PA-C  07/25/15 [place], MD 07/26/15 [phone]

## 2015-08-14 ENCOUNTER — Other Ambulatory Visit: Payer: Self-pay | Admitting: General Surgery

## 2015-08-14 DIAGNOSIS — K7689 Other specified diseases of liver: Secondary | ICD-10-CM | POA: Diagnosis not present

## 2015-08-27 ENCOUNTER — Encounter (INDEPENDENT_AMBULATORY_CARE_PROVIDER_SITE_OTHER): Payer: Self-pay

## 2015-08-27 ENCOUNTER — Ambulatory Visit (HOSPITAL_COMMUNITY)
Admission: RE | Admit: 2015-08-27 | Discharge: 2015-08-27 | Disposition: A | Discharge status: Home / Self Care | Payer: Medicare Other | Source: Ambulatory Visit | Attending: Anesthesiology | Admitting: Anesthesiology

## 2015-08-27 ENCOUNTER — Encounter (HOSPITAL_COMMUNITY)
Admission: RE | Admit: 2015-08-27 | Discharge: 2015-08-27 | Disposition: A | Discharge status: Home / Self Care | Payer: Medicare Other | Source: Ambulatory Visit | Attending: General Surgery | Admitting: General Surgery

## 2015-08-27 ENCOUNTER — Encounter (HOSPITAL_COMMUNITY): Payer: Self-pay

## 2015-08-27 DIAGNOSIS — I10 Essential (primary) hypertension: Secondary | ICD-10-CM | POA: Diagnosis not present

## 2015-08-27 DIAGNOSIS — R938 Abnormal findings on diagnostic imaging of other specified body structures: Secondary | ICD-10-CM | POA: Insufficient documentation

## 2015-08-27 DIAGNOSIS — K219 Gastro-esophageal reflux disease without esophagitis: Secondary | ICD-10-CM | POA: Diagnosis not present

## 2015-08-27 DIAGNOSIS — R9389 Abnormal findings on diagnostic imaging of other specified body structures: Secondary | ICD-10-CM

## 2015-08-27 DIAGNOSIS — Z01818 Encounter for other preprocedural examination: Secondary | ICD-10-CM | POA: Insufficient documentation

## 2015-08-27 HISTORY — DX: Constipation, unspecified: K59.00

## 2015-08-27 HISTORY — DX: Presence of spectacles and contact lenses: Z97.3

## 2015-08-27 HISTORY — DX: Personal history of other medical treatment: Z92.89

## 2015-08-27 HISTORY — DX: Personal history of urinary calculi: Z87.442

## 2015-08-27 HISTORY — DX: Personal history of other diseases of the digestive system: Z87.19

## 2015-08-27 HISTORY — DX: Unspecified osteoarthritis, unspecified site: M19.90

## 2015-08-27 HISTORY — DX: Pneumonia, unspecified organism: J18.9

## 2015-08-27 HISTORY — DX: Personal history of other diseases of the respiratory system: Z87.09

## 2015-08-27 LAB — URINALYSIS, ROUTINE W REFLEX MICROSCOPIC
BILIRUBIN URINE: NEGATIVE
Glucose, UA: NEGATIVE mg/dL
Hgb urine dipstick: NEGATIVE
KETONES UR: NEGATIVE mg/dL
Leukocytes, UA: NEGATIVE
NITRITE: NEGATIVE
PROTEIN: NEGATIVE mg/dL
Specific Gravity, Urine: 1.009 (ref 1.005–1.030)
pH: 7.5 (ref 5.0–8.0)

## 2015-08-27 LAB — COMPREHENSIVE METABOLIC PANEL
ALBUMIN: 4.3 g/dL (ref 3.5–5.0)
ALT: 23 U/L (ref 14–54)
AST: 23 U/L (ref 15–41)
Alkaline Phosphatase: 56 U/L (ref 38–126)
Anion gap: 11 (ref 5–15)
BILIRUBIN TOTAL: 0.8 mg/dL (ref 0.3–1.2)
BUN: 14 mg/dL (ref 6–20)
CO2: 31 mmol/L (ref 22–32)
Calcium: 9.6 mg/dL (ref 8.9–10.3)
Chloride: 99 mmol/L — ABNORMAL LOW (ref 101–111)
Creatinine, Ser: 0.75 mg/dL (ref 0.44–1.00)
GFR calc Af Amer: >60 mL/min (ref >60)
GFR calc non Af Amer: >60 mL/min (ref >60)
GLUCOSE: 88 mg/dL (ref 65–99)
POTASSIUM: 3.9 mmol/L (ref 3.5–5.1)
Sodium: 141 mmol/L (ref 135–145)
TOTAL PROTEIN: 7.3 g/dL (ref 6.5–8.1)

## 2015-08-27 LAB — CBC WITH DIFFERENTIAL/PLATELET
Basophils Absolute: 0.1 10*3/uL (ref 0.0–0.1)
Basophils Relative: 1 %
EOS PCT: 3 %
Eosinophils Absolute: 0.2 10*3/uL (ref 0.0–0.7)
HEMATOCRIT: 41.4 % (ref 36.0–46.0)
Hemoglobin: 14.3 g/dL (ref 12.0–15.0)
LYMPHS ABS: 2.1 10*3/uL (ref 0.7–4.0)
LYMPHS PCT: 31 %
MCH: 29.3 pg (ref 26.0–34.0)
MCHC: 34.5 g/dL (ref 30.0–36.0)
MCV: 84.8 fL (ref 78.0–100.0)
Monocytes Absolute: 0.7 10*3/uL (ref 0.1–1.0)
Monocytes Relative: 10 %
NEUTROS ABS: 3.8 10*3/uL (ref 1.7–7.7)
Neutrophils Relative %: 55 %
Platelets: 192 10*3/uL (ref 150–400)
RBC: 4.88 MIL/uL (ref 3.87–5.11)
RDW: 13.6 % (ref 11.5–15.5)
WBC: 6.9 10*3/uL (ref 4.0–10.5)

## 2015-08-27 LAB — PROTIME-INR
INR: 1.04 (ref 0.00–1.49)
Prothrombin Time: 13.8 seconds (ref 11.6–15.2)

## 2015-08-27 LAB — ABO/RH: ABO/RH(D): O POS

## 2015-08-27 NOTE — Patient Instructions (Signed)
[name]  08/27/2015   Your procedure is scheduled on: Tuesday August 28, 2015   Report to [facility] Main  Entrance take [place]  elevators to 3rd floor to  [place] at 5:30 AM.  Call this number if you have problems the morning of surgery [phone]   Remember: ONLY 1 PERSON MAY GO WITH YOU TO SHORT STAY TO GET  READY MORNING OF [place].  Do not eat food or drink liquids :After Midnight.     Take these medicines the morning of surgery with A SIP OF WATER: Omeprazole (Prilosec); Nasal Spray if needed (bring with you day of surgery)                               You may not have any metal on your body including hair pins and              piercings  Do not wear jewelry, make-up, lotions, powders or perfumes, deodorant             Do not wear nail polish.  Do not shave  48 hours prior to surgery.              Do not bring valuables to the hospital. [place].  Contacts, dentures or bridgework may not be worn into surgery.  Leave suitcase in the car. After surgery it may be brought to your room.   _____________________________________________________________________             [facility] - Preparing for Surgery Before surgery, you can play an important role.  Because skin is not sterile, your skin needs to be as free of germs as possible.  You can reduce the number of germs on your skin by washing with CHG (chlorahexidine gluconate) soap before surgery.  CHG is an antiseptic cleaner which kills germs and bonds with the skin to continue killing germs even after washing. Please DO NOT use if you have an allergy to CHG or antibacterial soaps.  If your skin becomes reddened/irritated stop using the CHG and inform your nurse when you arrive at Short Stay. Do not shave (including legs and underarms) for at least 48 hours prior to the first CHG shower.  You may shave your face/neck. Please follow these  instructions carefully:  1.  Shower with CHG Soap the night before surgery and the  morning of Surgery.  2.  If you choose to wash your hair, wash your hair first as usual with your  normal  shampoo.  3.  After you shampoo, rinse your hair and body thoroughly to remove the  shampoo.                           4.  Use CHG as you would any other liquid soap.  You can apply chg directly  to the skin and wash                       Gently with a scrungie or clean washcloth.  5.  Apply the CHG Soap to your body ONLY FROM THE NECK DOWN.   Do not use on face/ open  Wound or open sores. Avoid contact with eyes, ears mouth and genitals (private parts).                       Wash face,  Genitals (private parts) with your normal soap.             6.  Wash thoroughly, paying special attention to the area where your surgery  will be performed.  7.  Thoroughly rinse your body with warm water from the neck down.  8.  DO NOT shower/wash with your normal soap after using and rinsing off  the CHG Soap.                9.  Pat yourself dry with a clean towel.            10.  Wear clean pajamas.            11.  Place clean sheets on your bed the night of your first shower and do not  sleep with pets. Day of Surgery : Do not apply any lotions/deodorants the morning of surgery.  Please wear clean clothes to the hospital/surgery center.  FAILURE TO FOLLOW THESE INSTRUCTIONS MAY RESULT IN THE CANCELLATION OF YOUR SURGERY PATIENT SIGNATURE_________________________________  NURSE SIGNATURE__________________________________  ________________________________________________________________________

## 2015-08-27 NOTE — H&P (Signed)
[name] 08/14/2015 8:35 AM Location: [facility] Patient #: [id number] DOB: 1932-12-07 Married / Language: [name] / Race: White Female   History of Present Illness Connie Klein MD; 08/14/2015 9:33 AM) Patient words: hepatic mass.  The patient is a 82 year old female who presents with a liver mass. Patient is a very pleasant 82 year old female who presents with nausea, vomiting, abdominal pain, and enlargement of the left hepatic cyst. She had a hysterectomy at age 19. She had an ovarian cyst removed in 2010 that was felt to represent ovarian cancer. Final pathology was benign. She developed a bowel obstruction and had laparoscopic lysis of adhesions in 2013. She went to the ER at the beginning of January 4 symptoms were similar. She has had issues with her bowel movements and some right lower quadrant pain. She does have heartburn and however and has early satiety. This has gone on for quite some time. She also has issues with constipation. She denies any severe sudden episodes of pain in the upper abdomen. She does not have significant postprandial discomfort.  CT abd/pelvis 07/25/2015  CLINICAL DATA: Right upper quadrant abdominal pain with nausea beginning last night. Prior bowel obstruction.  EXAM: CT ABDOMEN AND PELVIS WITH CONTRAST  TECHNIQUE: Multidetector CT imaging of the abdomen and pelvis was performed using the standard protocol following bolus administration of intravenous contrast.  CONTRAST: 13mL OMNIPAQUE IOHEXOL 300 MG/ML SOLN  COMPARISON: CT abdomen 02/05/2011. CT abdomen and pelvis 10/08/2009.  FINDINGS: Minimal atelectasis is noted in the lung bases.  Decreased attenuation of the liver is consistent with steatosis. Left hepatic lobe lateral segment cyst has enlarged, now measuring 8.0 x 6.9 cm (previously 6.6 x 5.4 cm). The gallbladder, spleen, adrenal glands, and pancreas are unremarkable.  There are bilateral extrarenal  pelves. Subcentimeter low-density lesions in both kidneys are too small to fully characterize but most likely represent cysts. A 3 mm nonobstructing calculus in the lower pole of the left kidney is unchanged.  There is a small sliding hiatal hernia. Oral contrast is present in nondilated loops of distal small bowel and colon without evidence of obstruction. The appendix is unremarkable. The appendix is absent.  The bladder is unremarkable. The uterus is absent. Mild abdominal aortic atherosclerosis without aneurysm. No free fluid or enlarged lymph nodes are identified. Lumbar disc degeneration is greatest at L3-4. There is moderate to severe lower lumbar facet arthrosis.  IMPRESSION: 1. No acute abnormality identified in the abdomen or pelvis. 2. Nonobstructing left renal calculus. 3. Enlargement of hepatic cyst.   Other Problems [name], CMA; 08/14/2015 8:35 AM) Arthritis Gastroesophageal Reflux Disease High blood pressure Kidney Stone Oophorectomy  Past Surgical History [name], [name]; 08/14/2015 8:35 AM) Appendectomy Breast Biopsy Right. Cataract Surgery Bilateral. Colon Polyp Removal - Colonoscopy Colon Polyp Removal - Open Hysterectomy (not due to cancer) - Partial Oral Surgery Resection of Stomach Spinal Surgery - Neck  Diagnostic Studies History [name], CMA; 08/14/2015 8:35 AM) Colonoscopy within last year Mammogram within last year  Allergies [name], CMA; 08/14/2015 8:37 AM) Keflex *CEPHALOSPORINS* Nausea.  Medication History [name], CMA; 08/14/2015 8:37 AM) Omeprazole (20MG  Capsule DR, Oral daily) Active. Triamterene-HCTZ (37.5-25MG  Capsule, Oral daily) Active. Zofran (4MG  Tablet, Oral prn) Active. Medications Reconciled  Social History [name], CMA; 08/14/2015 8:35 AM) Caffeine use Coffee. No alcohol use No drug use Tobacco use Never smoker.  Family History [name], [place]; 08/14/2015  8:35 AM) Alcohol Abuse Brother. Arthritis Brother, Mother. Cerebrovascular Accident Mother. Diabetes Mellitus  Mother, Sister. Heart Disease Sister. Hypertension Mother, Sister. Kidney Disease Son. Respiratory Condition Father.  Pregnancy / Birth History [name], [place]; 08/14/2015 8:35 AM) Age of menopause 66-50 Gravida 3 Maternal age 22-25 Para 2    Review of Systems ([name]; 08/14/2015 8:35 AM) General Present- Fatigue. Not Present- Appetite Loss, Chills, Fever, Night Sweats, Weight Gain and Weight Loss. Skin Not Present- Change in Wart/Mole, Dryness, Hives, Jaundice, New Lesions, Non-Healing Wounds, Rash and Ulcer. HEENT Present- Wears glasses/contact lenses. Not Present- Earache, Hearing Loss, Hoarseness, Nose Bleed, Oral Ulcers, Ringing in the Ears, Seasonal Allergies, Sinus Pain, Sore Throat, Visual Disturbances and Yellow Eyes. Respiratory Not Present- Bloody sputum, Chronic Cough, Difficulty Breathing, Snoring and Wheezing. Breast Not Present- Breast Mass, Breast Pain, Nipple Discharge and Skin Changes. Cardiovascular Not Present- Chest Pain, Difficulty Breathing Lying Down, Leg Cramps, Palpitations, Rapid Heart Rate, Shortness of Breath and Swelling of Extremities. Gastrointestinal Present- Abdominal Pain, Bloating, Change in Bowel Habits, Constipation, Indigestion and Nausea. Not Present- Bloody Stool, Chronic diarrhea, Difficulty Swallowing, Excessive gas, Gets full quickly at meals, Hemorrhoids, Rectal Pain and Vomiting. Female Genitourinary Present- Pelvic Pain and Urgency. Not Present- Frequency, Nocturia and Painful Urination. Neurological Not Present- Decreased Memory, Fainting, Headaches, Numbness, Seizures, Tingling, Tremor, Trouble walking and Weakness. Psychiatric Not Present- Anxiety, Bipolar, Change in Sleep Pattern, Depression, Fearful and Frequent crying. Endocrine Not Present- Cold Intolerance, Excessive Hunger, Hair Changes, Heat  Intolerance, Hot flashes and New Diabetes. Hematology Not Present- Easy Bruising, Excessive bleeding, Gland problems, HIV and Persistent Infections.  Vitals [name] CMA; 08/14/2015 8:37 AM) 08/14/2015 8:37 AM Weight: 127.6 lb Height: 62in Body Surface Area: 1.58 m Body Mass Index: 23.34 kg/m  Temp.: 97.28F(Oral)  Pulse: 72 (Regular)  BP: 132/82 (Sitting, Left Arm, Standard)       Physical Exam Connie Klein MD; 08/14/2015 9:32 AM) General Mental Status-Alert. General Appearance-Consistent with stated age. Hydration-Well hydrated. Voice-Normal.  Head and Neck Head-normocephalic, atraumatic with no lesions or palpable masses. Trachea-midline. Thyroid Gland Characteristics - normal size and consistency.  Eye Eyeball - Bilateral-Extraocular movements intact. Sclera/Conjunctiva - Bilateral-No scleral icterus.  Chest and Lung Exam Chest and lung exam reveals -quiet, even and easy respiratory effort with no use of accessory muscles and on auscultation, normal breath sounds, no adventitious sounds and normal vocal resonance. Inspection Chest Wall - Normal. Back - normal.  Cardiovascular Cardiovascular examination reveals -normal heart sounds, regular rate and rhythm with no murmurs and normal pedal pulses bilaterally.  Abdomen Inspection Inspection of the abdomen reveals - No Hernias. Palpation/Percussion Palpation and Percussion of the abdomen reveal - Soft, Non Tender, No Rebound tenderness, No Rigidity (guarding) and No hepatosplenomegaly. Auscultation Auscultation of the abdomen reveals - Bowel sounds normal.  Neurologic Neurologic evaluation reveals -alert and oriented x 3 with no impairment of recent or remote memory. Mental Status-Normal.  Musculoskeletal Global Assessment -Note: no gross deformities.  Normal Exam - Left-Upper Extremity Strength Normal and Lower Extremity Strength Normal. Normal Exam -  Right-Upper Extremity Strength Normal and Lower Extremity Strength Normal.  Lymphatic Head & Neck  General Head & Neck Lymphatics: Bilateral - Description - Normal. Axillary  General Axillary Region: Bilateral - Description - Normal. Tenderness - Non Tender. Femoral & Inguinal  Generalized Femoral & Inguinal Lymphatics: Bilateral - Description - No Generalized lymphadenopathy.    Assessment & Plan Connie Klein MD; 08/14/2015 9:35 AM) LIVER CYST (K76.89) Impression: I discussed with the patient that all of her symptoms do not necessarily correlate with the location of her  cysts. However, her persistent nausea and early satiety do correlate with the liver cyst. Given the enlargement and location of the cyst directly over her stomach, it is reasonable to remove it. Because this encompasses the extreme left side of the left lateral segment, I would plan to remove it in its entirety.  I discussed that this is very common and not likely to be malignant but we will send to pathology any way. I reviewed that she will stay overnight in the hospital and will likely be able to go home the next day. I discussed postoperative restrictions of 2 weeks. I reviewed the risk of injury to adjacent structures, bile leak, risk of heart or lung complications, and other. She would like to have this done as soon as time is available.  45 min spent in evaluation, examination, counseling, and coordination of care. >50% spent in counseling. Current Plans You are being scheduled for surgery - Our schedulers will call you.  You should hear from our office's scheduling department within 5 working days about the location, date, and time of surgery. We try to make accommodations for patient's preferences in scheduling surgery, but sometimes the OR schedule or the surgeon's schedule prevents [place] from making those accommodations.  If you have not heard from our office [phone]) in 5 working days, call the office and  ask for your surgeon's nurse.  If you have other questions about your diagnosis, plan, or surgery, call the office and ask for your surgeon's nurse.  Pt Education - [facility] Free Text Education/Instructions: discussed with patient and provided information.   Signed by Connie Klein, MD (08/14/2015 9:36 AM)

## 2015-08-28 ENCOUNTER — Ambulatory Visit (HOSPITAL_COMMUNITY): Payer: Medicare Other | Admitting: Anesthesiology

## 2015-08-28 ENCOUNTER — Encounter (HOSPITAL_COMMUNITY)
Admission: RE | Disposition: A | Discharge status: Home / Self Care | Payer: Self-pay | Source: Ambulatory Visit | Attending: General Surgery

## 2015-08-28 ENCOUNTER — Ambulatory Visit (HOSPITAL_COMMUNITY)
Admission: RE | Admit: 2015-08-28 | Discharge: 2015-08-30 | Disposition: A | Discharge status: Home / Self Care | Payer: Medicare Other | Source: Ambulatory Visit | Attending: General Surgery | Admitting: General Surgery

## 2015-08-28 ENCOUNTER — Encounter (HOSPITAL_COMMUNITY): Payer: Self-pay | Admitting: Anesthesiology

## 2015-08-28 DIAGNOSIS — K219 Gastro-esophageal reflux disease without esophagitis: Secondary | ICD-10-CM | POA: Insufficient documentation

## 2015-08-28 DIAGNOSIS — D134 Benign neoplasm of liver: Secondary | ICD-10-CM | POA: Insufficient documentation

## 2015-08-28 DIAGNOSIS — K660 Peritoneal adhesions (postprocedural) (postinfection): Secondary | ICD-10-CM | POA: Diagnosis not present

## 2015-08-28 DIAGNOSIS — Z87442 Personal history of urinary calculi: Secondary | ICD-10-CM | POA: Diagnosis not present

## 2015-08-28 DIAGNOSIS — K7689 Other specified diseases of liver: Secondary | ICD-10-CM | POA: Insufficient documentation

## 2015-08-28 DIAGNOSIS — M1990 Unspecified osteoarthritis, unspecified site: Secondary | ICD-10-CM | POA: Insufficient documentation

## 2015-08-28 DIAGNOSIS — I10 Essential (primary) hypertension: Secondary | ICD-10-CM | POA: Diagnosis not present

## 2015-08-28 DIAGNOSIS — Z79899 Other long term (current) drug therapy: Secondary | ICD-10-CM | POA: Insufficient documentation

## 2015-08-28 DIAGNOSIS — Z90710 Acquired absence of both cervix and uterus: Secondary | ICD-10-CM | POA: Insufficient documentation

## 2015-08-28 HISTORY — PX: LAPAROSCOPIC HEPATECTOMY: SHX5897

## 2015-08-28 LAB — TYPE AND SCREEN
ABO/RH(D): O POS
ANTIBODY SCREEN: NEGATIVE

## 2015-08-28 SURGERY — HEPATECTOMY, LAPAROSCOPIC
Anesthesia: General | Site: Abdomen

## 2015-08-28 MED ORDER — DIPHENHYDRAMINE HCL 50 MG/ML IJ SOLN: 12.5000 mg | Freq: Four times a day (QID) | INTRAMUSCULAR | Status: DC | PRN

## 2015-08-28 MED ORDER — EVICEL 5 ML EX KIT
PACK | Freq: Once | CUTANEOUS | Status: AC
  Administered 2015-08-28: 1
  Filled 2015-08-28: qty 1

## 2015-08-28 MED ORDER — CIPROFLOXACIN IN D5W 400 MG/200ML IV SOLN
400.0000 mg | INTRAVENOUS | Status: AC
  Administered 2015-08-28: 400 mg via INTRAVENOUS

## 2015-08-28 MED ORDER — FENTANYL CITRATE (PF) 100 MCG/2ML IJ SOLN
INTRAMUSCULAR | Status: DC | PRN
  Administered 2015-08-28 (×4): 50 ug via INTRAVENOUS

## 2015-08-28 MED ORDER — DOCUSATE SODIUM 100 MG PO CAPS
100.0000 mg | ORAL_CAPSULE | Freq: Two times a day (BID) | ORAL | Status: DC
  Administered 2015-08-28 – 2015-08-29 (×3): 100 mg via ORAL
  Filled 2015-08-28 (×5): qty 1

## 2015-08-28 MED ORDER — PROMETHAZINE HCL 25 MG PO TABS: 12.5000 mg | ORAL_TABLET | Freq: Four times a day (QID) | ORAL | Status: DC | PRN

## 2015-08-28 MED ORDER — BISACODYL 10 MG RE SUPP: 10.0000 mg | Freq: Every day | RECTAL | Status: DC | PRN

## 2015-08-28 MED ORDER — ACETAMINOPHEN 325 MG PO TABS: 650.0000 mg | ORAL_TABLET | Freq: Four times a day (QID) | ORAL | Status: DC | PRN

## 2015-08-28 MED ORDER — ONDANSETRON HCL 4 MG/2ML IJ SOLN
INTRAMUSCULAR | Status: DC | PRN
  Administered 2015-08-28: 4 mg via INTRAVENOUS

## 2015-08-28 MED ORDER — ONDANSETRON HCL 4 MG/2ML IJ SOLN
INTRAMUSCULAR | Status: AC
  Filled 2015-08-28: qty 2

## 2015-08-28 MED ORDER — OXYCODONE HCL 5 MG PO TABS: 5.0000 mg | ORAL_TABLET | Freq: Once | ORAL | Status: DC | PRN

## 2015-08-28 MED ORDER — PROPOFOL 10 MG/ML IV BOLUS
INTRAVENOUS | Status: AC
  Filled 2015-08-28: qty 40

## 2015-08-28 MED ORDER — LACTATED RINGERS IV SOLN
INTRAVENOUS | Status: DC | PRN
  Administered 2015-08-28 (×2): via INTRAVENOUS

## 2015-08-28 MED ORDER — OXYCODONE HCL 5 MG PO TABS
5.0000 mg | ORAL_TABLET | ORAL | Status: DC | PRN
  Administered 2015-08-28: 5 mg via ORAL
  Filled 2015-08-28: qty 1

## 2015-08-28 MED ORDER — ROCURONIUM BROMIDE 100 MG/10ML IV SOLN
INTRAVENOUS | Status: AC
  Filled 2015-08-28: qty 1

## 2015-08-28 MED ORDER — PANTOPRAZOLE SODIUM 40 MG IV SOLR
40.0000 mg | Freq: Every day | INTRAVENOUS | Status: DC
  Administered 2015-08-28 – 2015-08-29 (×2): 40 mg via INTRAVENOUS
  Filled 2015-08-28 (×3): qty 40

## 2015-08-28 MED ORDER — PHENYLEPHRINE HCL 10 MG/ML IJ SOLN
INTRAMUSCULAR | Status: DC | PRN
  Administered 2015-08-28 (×2): 40 ug via INTRAVENOUS
  Administered 2015-08-28: 80 ug via INTRAVENOUS
  Administered 2015-08-28: 40 ug via INTRAVENOUS

## 2015-08-28 MED ORDER — CIPROFLOXACIN IN D5W 400 MG/200ML IV SOLN
INTRAVENOUS | Status: AC
  Filled 2015-08-28: qty 200

## 2015-08-28 MED ORDER — MORPHINE SULFATE (PF) 2 MG/ML IV SOLN: 1.0000 mg | INTRAVENOUS | Status: DC | PRN

## 2015-08-28 MED ORDER — LIDOCAINE HCL (PF) 1 % IJ SOLN
INTRAMUSCULAR | Status: DC | PRN
  Administered 2015-08-28: 9.5 mL

## 2015-08-28 MED ORDER — SUGAMMADEX SODIUM 200 MG/2ML IV SOLN
INTRAVENOUS | Status: DC | PRN
  Administered 2015-08-28: 119.4 mg via INTRAVENOUS

## 2015-08-28 MED ORDER — HYDROMORPHONE HCL 1 MG/ML IJ SOLN: 0.2500 mg | INTRAMUSCULAR | Status: DC | PRN

## 2015-08-28 MED ORDER — MEPERIDINE HCL 50 MG/ML IJ SOLN: 6.2500 mg | INTRAMUSCULAR | Status: DC | PRN

## 2015-08-28 MED ORDER — OXYCODONE HCL 5 MG/5ML PO SOLN: 5.0000 mg | Freq: Once | ORAL | Status: DC | PRN

## 2015-08-28 MED ORDER — METOCLOPRAMIDE HCL 5 MG/ML IJ SOLN
INTRAMUSCULAR | Status: AC
  Filled 2015-08-28: qty 2

## 2015-08-28 MED ORDER — DEXAMETHASONE SODIUM PHOSPHATE 10 MG/ML IJ SOLN
INTRAMUSCULAR | Status: AC
  Filled 2015-08-28: qty 1

## 2015-08-28 MED ORDER — DIPHENHYDRAMINE HCL 12.5 MG/5ML PO ELIX: 12.5000 mg | ORAL_SOLUTION | Freq: Four times a day (QID) | ORAL | Status: DC | PRN

## 2015-08-28 MED ORDER — SODIUM CHLORIDE 0.9 % IJ SOLN
INTRAMUSCULAR | Status: AC
  Filled 2015-08-28: qty 10

## 2015-08-28 MED ORDER — ACETAMINOPHEN 650 MG RE SUPP: 650.0000 mg | Freq: Four times a day (QID) | RECTAL | Status: DC | PRN

## 2015-08-28 MED ORDER — KCL IN DEXTROSE-NACL 20-5-0.45 MEQ/L-%-% IV SOLN
INTRAVENOUS | Status: DC
  Administered 2015-08-28: 13:00:00 via INTRAVENOUS
  Filled 2015-08-28 (×4): qty 1000

## 2015-08-28 MED ORDER — ONDANSETRON HCL 4 MG PO TABS: 4.0000 mg | ORAL_TABLET | Freq: Two times a day (BID) | ORAL | Status: DC | PRN

## 2015-08-28 MED ORDER — ONDANSETRON 4 MG PO TBDP: 4.0000 mg | ORAL_TABLET | Freq: Four times a day (QID) | ORAL | Status: DC | PRN

## 2015-08-28 MED ORDER — HYDROMORPHONE HCL 1 MG/ML IJ SOLN
0.2500 mg | INTRAMUSCULAR | Status: DC | PRN
  Administered 2015-08-28 (×2): 0.25 mg via INTRAVENOUS

## 2015-08-28 MED ORDER — 0.9 % SODIUM CHLORIDE (POUR BTL) OPTIME
TOPICAL | Status: DC | PRN
  Administered 2015-08-28: 1000 mL

## 2015-08-28 MED ORDER — BUPIVACAINE-EPINEPHRINE (PF) 0.25% -1:200000 IJ SOLN
INTRAMUSCULAR | Status: AC
  Filled 2015-08-28: qty 30

## 2015-08-28 MED ORDER — FENTANYL CITRATE (PF) 250 MCG/5ML IJ SOLN
INTRAMUSCULAR | Status: AC
  Filled 2015-08-28: qty 5

## 2015-08-28 MED ORDER — PROMETHAZINE HCL 25 MG/ML IJ SOLN
INTRAMUSCULAR | Status: AC
  Filled 2015-08-28: qty 1

## 2015-08-28 MED ORDER — LIDOCAINE HCL 1 % IJ SOLN
INTRAMUSCULAR | Status: AC
  Filled 2015-08-28: qty 20

## 2015-08-28 MED ORDER — SUGAMMADEX SODIUM 200 MG/2ML IV SOLN
INTRAVENOUS | Status: AC
  Filled 2015-08-28: qty 2

## 2015-08-28 MED ORDER — LIDOCAINE HCL (CARDIAC) 20 MG/ML IV SOLN
INTRAVENOUS | Status: AC
  Filled 2015-08-28: qty 10

## 2015-08-28 MED ORDER — TRIAMTERENE-HCTZ 37.5-25 MG PO CAPS
1.0000 | ORAL_CAPSULE | Freq: Every day | ORAL | Status: DC
  Administered 2015-08-28 – 2015-08-29 (×2): 1 via ORAL
  Filled 2015-08-28 (×3): qty 1

## 2015-08-28 MED ORDER — BUPIVACAINE-EPINEPHRINE 0.25% -1:200000 IJ SOLN
INTRAMUSCULAR | Status: DC | PRN
  Administered 2015-08-28: 9.5 mL

## 2015-08-28 MED ORDER — LACTATED RINGERS IR SOLN
Status: DC | PRN
  Administered 2015-08-28: 1000 mL

## 2015-08-28 MED ORDER — CIPROFLOXACIN IN D5W 400 MG/200ML IV SOLN
400.0000 mg | Freq: Two times a day (BID) | INTRAVENOUS | Status: AC
  Administered 2015-08-28: 400 mg via INTRAVENOUS
  Filled 2015-08-28: qty 200

## 2015-08-28 MED ORDER — ROCURONIUM BROMIDE 100 MG/10ML IV SOLN
INTRAVENOUS | Status: DC | PRN
  Administered 2015-08-28: 35 mg via INTRAVENOUS

## 2015-08-28 MED ORDER — SALINE SPRAY 0.65 % NA SOLN
1.0000 | Freq: Every day | NASAL | Status: DC | PRN
  Filled 2015-08-28: qty 44

## 2015-08-28 MED ORDER — EPHEDRINE SULFATE 50 MG/ML IJ SOLN
INTRAMUSCULAR | Status: AC
  Filled 2015-08-28: qty 1

## 2015-08-28 MED ORDER — LIDOCAINE HCL (CARDIAC) 20 MG/ML IV SOLN
INTRAVENOUS | Status: DC | PRN
  Administered 2015-08-28: 50 mg via INTRAVENOUS

## 2015-08-28 MED ORDER — HYDROMORPHONE HCL 1 MG/ML IJ SOLN
INTRAMUSCULAR | Status: AC
  Filled 2015-08-28: qty 1

## 2015-08-28 MED ORDER — METOCLOPRAMIDE HCL 5 MG/ML IJ SOLN
INTRAMUSCULAR | Status: DC | PRN
Start: 2015-08-28 — End: 2015-08-28
  Administered 2015-08-28: 10 mg via INTRAVENOUS

## 2015-08-28 MED ORDER — ACETAMINOPHEN 500 MG PO TABS
1000.0000 mg | ORAL_TABLET | Freq: Four times a day (QID) | ORAL | Status: DC
  Administered 2015-08-28 – 2015-08-29 (×5): 1000 mg via ORAL
  Filled 2015-08-28 (×12): qty 2

## 2015-08-28 MED ORDER — PROMETHAZINE HCL 25 MG/ML IJ SOLN
6.2500 mg | INTRAMUSCULAR | Status: DC | PRN
  Administered 2015-08-28: 6.25 mg via INTRAVENOUS

## 2015-08-28 MED ORDER — LACTATED RINGERS IV SOLN
INTRAVENOUS | Status: DC
Start: 2015-08-28 — End: 2015-08-28

## 2015-08-28 MED ORDER — DEXAMETHASONE SODIUM PHOSPHATE 10 MG/ML IJ SOLN
INTRAMUSCULAR | Status: DC | PRN
  Administered 2015-08-28: 10 mg via INTRAVENOUS

## 2015-08-28 MED ORDER — SIMETHICONE 80 MG PO CHEW: 40.0000 mg | CHEWABLE_TABLET | Freq: Four times a day (QID) | ORAL | Status: DC | PRN

## 2015-08-28 MED ORDER — ONDANSETRON HCL 4 MG/2ML IJ SOLN: 4.0000 mg | Freq: Four times a day (QID) | INTRAMUSCULAR | Status: DC | PRN

## 2015-08-28 MED ORDER — PROPOFOL 10 MG/ML IV BOLUS
INTRAVENOUS | Status: DC | PRN
  Administered 2015-08-28: 140 mg via INTRAVENOUS

## 2015-08-28 MED ORDER — METHOCARBAMOL 500 MG PO TABS: 500.0000 mg | ORAL_TABLET | Freq: Four times a day (QID) | ORAL | Status: DC | PRN

## 2015-08-28 SURGICAL SUPPLY — 117 items
ADH SKN CLS APL DERMABOND .7 (GAUZE/BANDAGES/DRESSINGS) ×1
BAG SPEC RTRVL LRG 6X4 10 (ENDOMECHANICALS) ×1
BLADE EXTENDED COATED 6.5IN (ELECTRODE) ×1 IMPLANT
BLADE HEX COATED 2.75 (ELECTRODE) ×3 IMPLANT
BLADE SURG SZ10 CARB STEEL (BLADE) ×3 IMPLANT
BOOT SUTURE VASCULAR YLW (MISCELLANEOUS)
CATH KIT ON-Q SILVERSOAK 7.5IN (CATHETERS) IMPLANT
CHLORAPREP W/TINT 26ML (MISCELLANEOUS) ×3 IMPLANT
CLAMP SUTURE YELLOW 5 PAIRS (MISCELLANEOUS) IMPLANT
CLIP LIGATING HEM O LOK PURPLE (MISCELLANEOUS) IMPLANT
CLIP LIGATING HEMO O LOK GREEN (MISCELLANEOUS) IMPLANT
CLIP LIGATING HEMOLOK MED (MISCELLANEOUS) IMPLANT
CLIP TI LARGE 6 (CLIP) ×1 IMPLANT
CLIP TI MEDIUM 6 (CLIP) ×1 IMPLANT
COVER SURGICAL LIGHT HANDLE (MISCELLANEOUS) ×3 IMPLANT
DECANTER SPIKE VIAL GLASS SM (MISCELLANEOUS) ×3 IMPLANT
DERMABOND ADVANCED (GAUZE/BANDAGES/DRESSINGS) ×2
DERMABOND ADVANCED .7 DNX12 (GAUZE/BANDAGES/DRESSINGS) ×1 IMPLANT
DEVICE SUT QUICK LOAD TK 5 (STAPLE) ×1 IMPLANT
DEVICE SUT TI-KNOT TK 5X26 (MISCELLANEOUS) ×2 IMPLANT
DEVICE TI KNOT TK5 (MISCELLANEOUS) ×1
DISSECTOR ROUND CHERRY 3/8 STR (MISCELLANEOUS) IMPLANT
DRAPE C-ARM 42X120 X-RAY (DRAPES) IMPLANT
DRAPE CAMERA CLOSED 9X96 (DRAPES) IMPLANT
DRAPE SHEET LG 3/4 BI-LAMINATE (DRAPES) IMPLANT
DRAPE UTILITY XL STRL (DRAPES) IMPLANT
DRAPE WARM FLUID 44X44 (DRAPE) IMPLANT
DRESSING TELFA ISLAND 4X8 (GAUZE/BANDAGES/DRESSINGS) IMPLANT
DRSG PAD ABDOMINAL 8X10 ST (GAUZE/BANDAGES/DRESSINGS) IMPLANT
DRSG TELFA 4X10 ISLAND STR (GAUZE/BANDAGES/DRESSINGS) IMPLANT
DRSG TELFA PLUS 4X6 ADH ISLAND (GAUZE/BANDAGES/DRESSINGS) IMPLANT
ELECT L-HOOK LAP 45CM DISP (ELECTROSURGICAL) ×3
ELECTRODE L-HOOK LAP 45CM DISP (ELECTROSURGICAL) ×1 IMPLANT
EVACUATOR SILICONE 100CC (DRAIN) IMPLANT
GAUZE SPONGE 4X4 12PLY STRL (GAUZE/BANDAGES/DRESSINGS) IMPLANT
GAUZE SPONGE 4X4 16PLY XRAY LF (GAUZE/BANDAGES/DRESSINGS) IMPLANT
GLOVE BIO SURGEON STRL SZ 6 (GLOVE) ×3 IMPLANT
GLOVE INDICATOR 6.5 STRL GRN (GLOVE) ×6 IMPLANT
GOWN STRL REUS W/ TWL XL LVL3 (GOWN DISPOSABLE) ×3 IMPLANT
GOWN STRL REUS W/TWL 2XL LVL3 (GOWN DISPOSABLE) ×3 IMPLANT
GOWN STRL REUS W/TWL LRG LVL3 (GOWN DISPOSABLE) IMPLANT
GOWN STRL REUS W/TWL XL LVL3 (GOWN DISPOSABLE) ×9
HANDLE SUCTION POOLE (INSTRUMENTS) ×1 IMPLANT
HEMOSTAT SNOW SURGICEL 2X4 (HEMOSTASIS) ×3 IMPLANT
HEMOSTAT SURGICEL 4X8 (HEMOSTASIS) IMPLANT
KIT BASIN OR (CUSTOM PROCEDURE TRAY) ×3 IMPLANT
LOOP MINI RED (MISCELLANEOUS) IMPLANT
LOOP VESSEL MAXI BLUE (MISCELLANEOUS) IMPLANT
NEEDLE BIOPSY 14GX4.5 SOFT TIS (NEEDLE) IMPLANT
NEEDLE HYPO 22GX1.5 SAFETY (NEEDLE) IMPLANT
NS IRRIG 1000ML POUR BTL (IV SOLUTION) ×3 IMPLANT
PACK GENERAL/GYN (CUSTOM PROCEDURE TRAY) ×3 IMPLANT
PACK UNIVERSAL I (CUSTOM PROCEDURE TRAY) ×3 IMPLANT
POUCH SPECIMEN RETRIEVAL 10MM (ENDOMECHANICALS) ×3 IMPLANT
QUICK LOAD TK 5 (STAPLE) ×1
RELOAD STAPLER WHITE 60MM (STAPLE) ×5 IMPLANT
SCISSORS HARMONIC WAVE 18CM (INSTRUMENTS) IMPLANT
SET IRRIG TUBING LAPAROSCOPIC (IRRIGATION / IRRIGATOR) ×3 IMPLANT
SHEARS FOC LG CVD HARMONIC 17C (MISCELLANEOUS) IMPLANT
SHEARS HARMONIC ACE PLUS 36CM (ENDOMECHANICALS) ×3 IMPLANT
SLEEVE SURGEON STRL (DRAPES) IMPLANT
SLEEVE XCEL OPT CAN 5 100 (ENDOMECHANICALS) ×9 IMPLANT
SOLUTION ANTI FOG 6CC (MISCELLANEOUS) ×3 IMPLANT
SPONGE DRAIN TRACH 4X4 STRL 2S (GAUZE/BANDAGES/DRESSINGS) IMPLANT
SPONGE LAP 18X18 X RAY DECT (DISPOSABLE) IMPLANT
SPONGE SURGIFOAM ABS GEL 100 (HEMOSTASIS) IMPLANT
STAPLE ECHEON FLEX 60 POW ENDO (STAPLE) ×3 IMPLANT
STAPLER RELOAD WHITE 60MM (STAPLE) ×15
STAPLER VISISTAT 35W (STAPLE) IMPLANT
SUCTION POOLE HANDLE (INSTRUMENTS) ×3
SUT CHROMIC 3 0 SH 27 (SUTURE) IMPLANT
SUT CHROMIC 4 0 RB 1X27 (SUTURE) IMPLANT
SUT ETHILON 2 0 PS N (SUTURE) IMPLANT
SUT MNCRL AB 4-0 PS2 18 (SUTURE) IMPLANT
SUT PDS AB 1 TP1 54 (SUTURE) IMPLANT
SUT PROLENE 3 0 SH 48 (SUTURE) IMPLANT
SUT PROLENE 3 0 SH1 36 (SUTURE) IMPLANT
SUT PROLENE 4 0 RB 1 (SUTURE)
SUT PROLENE 4-0 RB1 .5 CRCL 36 (SUTURE) IMPLANT
SUT PROLENE 5 0 CC 1 (SUTURE) IMPLANT
SUT SILK 0 FSL (SUTURE) ×1 IMPLANT
SUT SILK 2 0 (SUTURE)
SUT SILK 2 0 SH CR/8 (SUTURE) IMPLANT
SUT SILK 2-0 18XBRD TIE 12 (SUTURE) IMPLANT
SUT SILK 3 0 (SUTURE)
SUT SILK 3 0 SH CR/8 (SUTURE) IMPLANT
SUT SILK 3-0 18XBRD TIE 12 (SUTURE) IMPLANT
SUT VIC AB 2-0 SH 27 (SUTURE) ×3
SUT VIC AB 2-0 SH 27X BRD (SUTURE) ×1 IMPLANT
SUT VIC AB 3-0 SH 18 (SUTURE) IMPLANT
SUT VIC AB 4-0 SH 18 (SUTURE) IMPLANT
SUT VICRYL 0 UR6 27IN ABS (SUTURE) ×3 IMPLANT
SUT VICRYL 2 0 18  UND BR (SUTURE)
SUT VICRYL 2 0 18 UND BR (SUTURE) IMPLANT
SUT VICRYL 3 0 BR 18  UND (SUTURE)
SUT VICRYL 3 0 BR 18 UND (SUTURE) IMPLANT
SYR CONTROL 10ML LL (SYRINGE) IMPLANT
SYRINGE 10CC LL (SYRINGE) IMPLANT
SYS LAPSCP GELPORT 120MM (MISCELLANEOUS)
SYSTEM LAPSCP GELPORT 120MM (MISCELLANEOUS) IMPLANT
TAG SUTURE CLAMP YLW 5PR (MISCELLANEOUS)
TAPE UMBILICAL COTTON 1/8X30 (MISCELLANEOUS) IMPLANT
TIP RIGID 35CM EVICEL (HEMOSTASIS) ×3 IMPLANT
TOWEL BLUE STERILE X RAY DET (MISCELLANEOUS) IMPLANT
TOWEL OR 17X26 10 PK STRL BLUE (TOWEL DISPOSABLE) ×3 IMPLANT
TRAP SPECIMEN MUCOUS 40CC (MISCELLANEOUS) ×3 IMPLANT
TRAY FOLEY W/METER SILVER 14FR (SET/KITS/TRAYS/PACK) ×3 IMPLANT
TRAY FOLEY W/METER SILVER 16FR (SET/KITS/TRAYS/PACK) ×3 IMPLANT
TROCAR BLADELESS OPT 5 100 (ENDOMECHANICALS) IMPLANT
TROCAR BLADELESS OPT 5 75 (ENDOMECHANICALS) IMPLANT
TROCAR XCEL 12X100 BLDLESS (ENDOMECHANICALS) ×3 IMPLANT
TROCAR XCEL BLUNT TIP 100MML (ENDOMECHANICALS) IMPLANT
TROCAR XCEL NON-BLD 11X100MML (ENDOMECHANICALS) IMPLANT
TUBING INSUF HEATED (TUBING) ×3 IMPLANT
TUBING INSUFFLATION 10FT LAP (TUBING) IMPLANT
TUNNELER SHEATH ON-Q 16GX12 DP (PAIN MANAGEMENT) IMPLANT
YANKAUER SUCT BULB TIP NO VENT (SUCTIONS) IMPLANT

## 2015-08-28 NOTE — Anesthesia Postprocedure Evaluation (Signed)
Anesthesia Post Note  Patient: [name]  Procedure(s) Performed: Procedure(s) (LRB): LAPAROSCOPIC PARTIAL HEPATECTOMY (N/A)  Patient location during evaluation: PACU Anesthesia Type: General Level of consciousness: awake and alert Pain management: pain level controlled Vital Signs Assessment: post-procedure vital signs reviewed and stable Respiratory status: spontaneous breathing, nonlabored ventilation, respiratory function stable and patient connected to nasal cannula oxygen Cardiovascular status: blood pressure returned to baseline and stable Postop Assessment: no signs of nausea or vomiting Anesthetic complications: no    Last Vitals:  Filed Vitals:   08/28/15 1115 08/28/15 1226  BP: 166/72 142/65  Pulse: 84 80  Temp: 36.7 C 36.9 C  Resp: 18 18    Last Pain:  Filed Vitals:   08/28/15 1226  PainSc: 2                  Chaquana Nichols A

## 2015-08-28 NOTE — Transfer of Care (Signed)
Immediate Anesthesia Transfer of Care Note  Patient: [name]  Procedure(s) Performed: Procedure(s): LAPAROSCOPIC PARTIAL HEPATECTOMY (N/A)  Patient Location: PACU  Anesthesia Type:General  Level of Consciousness:  sedated, patient cooperative and responds to stimulation  Airway & Oxygen Therapy:Patient Spontanous Breathing and Patient connected to face mask oxgen  Post-op Assessment:  Report given to PACU RN and Post -op Vital signs reviewed and stable  Post vital signs:  Reviewed and stable  Last Vitals:  Filed Vitals:   08/28/15 0530  BP: 136/62  Pulse: 68  Temp: 36.6 C  Resp: 16    Complications: No apparent anesthesia complications

## 2015-08-28 NOTE — Progress Notes (Signed)
5th floor notified pt will be in 1534 in 20  Minutes.

## 2015-08-28 NOTE — Interval H&P Note (Signed)
History and Physical Interval Note:  08/28/2015 7:42 AM  [name]  has presented today for surgery, with the diagnosis of LIVER CYST  The various methods of treatment have been discussed with the patient and family. After consideration of risks, benefits and other options for treatment, the patient has consented to  Procedure(s): LAPAROSCOPIC PARTIAL HEPATECTOMY (N/A) as a surgical intervention .  The patient's history has been reviewed, patient examined, no change in status, stable for surgery.  I have reviewed the patient's chart and labs.  Questions were answered to the patient's satisfaction.     [name]

## 2015-08-28 NOTE — Op Note (Signed)
PRE-OPERATIVE DIAGNOSIS: liver cyst, left lateral segment.    POST-OPERATIVE DIAGNOSIS: Same  PROCEDURE: Procedure(s): Laparoscopic partial hepatectomy with liver cyst unroofing.     SURGEON: Surgeon(s): Stark Klein, MD  ASSISTANT:  Sharin Grave, RNFA  ANESTHESIA: local and general  DRAINS: none   LOCAL MEDICATIONS USED: BUPIVICAINE and LIDOCAINE   SPECIMEN: Source of Specimen: liver cyst wall, portion of liver, and liver cyst fluid  DISPOSITION OF SPECIMEN: PATHOLOGY/cytology  COUNTS: YES  PLAN OF CARE: Admit to inpatient   PATIENT DISPOSITION: PACU - hemodynamically stable.  FINDINGS: Large liver cyst    EBL: minimal  PROCEDURE:   Pt was identified in the holding area and taken to the OR where she was placed supine on the operating room table. General anesthesia was induced. The patient's abdomen was prepped and draped in sterile fashion. A timeout was performed according to the surgical safety checklist. When all was correct, we continued.   A left subcostal incision was made after administration of local anesthesia.  A 5 mm Optiview trocar was placed under direct visualization.   Pneumoperitoneum was achieved to a pressure of 15 mm Hg. There were many midline adhesions.  Two LLQ trocars were placed to take these down.  The harmonic and scissors were used to take down the adhesions.  A small serosal defect was seen, as is anticipated with significant adhesiolysis.  This was oversewn with a 2-0 vicryl.    A 12 mm trocar was placed in the infraumbilical location and a 5 mm port was placed in the upper midline. The cyst was identified. The harmonic scalpel was used to enter the cyst. The fluid was suctioned out. The stapler was used to divide the left lateral segment with the cyst.  There was a small amount of residual cyst.  This was opened with the harmonic.  Evicel and SNOW hemostatic agent were placed against the posterior cyst wall. Hemostasis  was achieved with cautery.A four quadrant inspection was negative for evidence of bleeding or gross pathology.   The 5 mm trocars were removed. The pneumoperitoneum was evacuated. The pursestring suture was tied down. There was no residual palpable fascial defect. The skin was closed with 4-0 monocryl in subcuticular fashion. Needle, sponge, and instrument counts were correct times 2.   The patient was taken to the PACU in stable condition.

## 2015-08-28 NOTE — Anesthesia Procedure Notes (Signed)
Procedure Name: Intubation Date/Time: 08/28/2015 7:55 AM Performed by: Violetta Lavalle, Virgel Gess Pre-anesthesia Checklist: Patient identified, Emergency Drugs available, Suction available, Patient being monitored and Timeout performed Patient Re-evaluated:Patient Re-evaluated prior to inductionOxygen Delivery Method: Circle system utilized Preoxygenation: Pre-oxygenation with 100% oxygen Intubation Type: IV induction Ventilation: Mask ventilation without difficulty Laryngoscope Size: Mac and 3 Grade View: Grade II Tube type: Oral Tube size: 7.5 mm Number of attempts: 1 Airway Equipment and Method: Stylet Placement Confirmation: ETT inserted through vocal cords under direct vision,  positive ETCO2,  CO2 detector and breath sounds checked- equal and bilateral Secured at: 21 cm Tube secured with: Tape Dental Injury: Teeth and Oropharynx as per pre-operative assessment

## 2015-08-28 NOTE — Anesthesia Preprocedure Evaluation (Signed)
Anesthesia Evaluation  Patient identified by MRN, date of birth, ID band Patient awake    Reviewed: Allergy & Precautions, NPO status , Patient's Chart, lab work & pertinent test results  Airway Mallampati: I  TM Distance: >3 FB Neck ROM: Full    Dental  (+) Teeth Intact, Dental Advisory Given   Pulmonary    breath sounds clear to auscultation       Cardiovascular hypertension, Pt. on medications  Rhythm:Regular Rate:Normal     Neuro/Psych    GI/Hepatic GERD  Medicated and Controlled,  Endo/Other    Renal/GU      Musculoskeletal   Abdominal   Peds  Hematology   Anesthesia Other Findings   Reproductive/Obstetrics                             Anesthesia Physical Anesthesia Plan  ASA: II  Anesthesia Plan: General   Post-op Pain Management:    Induction: Intravenous  Airway Management Planned: Oral ETT  Additional Equipment:   Intra-op Plan:   Post-operative Plan: Extubation in OR  Informed Consent: I have reviewed the patients History and Physical, chart, labs and discussed the procedure including the risks, benefits and alternatives for the proposed anesthesia with the patient or authorized representative who has indicated his/her understanding and acceptance.   Dental advisory given  Plan Discussed with: CRNA, Anesthesiologist and Surgeon  Anesthesia Plan Comments:         Anesthesia Quick Evaluation

## 2015-08-29 DIAGNOSIS — K7689 Other specified diseases of liver: Secondary | ICD-10-CM | POA: Diagnosis not present

## 2015-08-29 LAB — CBC
HEMATOCRIT: 38.4 % (ref 36.0–46.0)
HEMOGLOBIN: 13.2 g/dL (ref 12.0–15.0)
MCH: 29.7 pg (ref 26.0–34.0)
MCHC: 34.4 g/dL (ref 30.0–36.0)
MCV: 86.5 fL (ref 78.0–100.0)
Platelets: 198 10*3/uL (ref 150–400)
RBC: 4.44 MIL/uL (ref 3.87–5.11)
RDW: 13.9 % (ref 11.5–15.5)
WBC: 14.1 10*3/uL — ABNORMAL HIGH (ref 4.0–10.5)

## 2015-08-29 LAB — PHOSPHORUS: Phosphorus: 2.7 mg/dL (ref 2.5–4.6)

## 2015-08-29 LAB — COMPREHENSIVE METABOLIC PANEL
ALBUMIN: 3.5 g/dL (ref 3.5–5.0)
ALK PHOS: 41 U/L (ref 38–126)
ALT: 85 U/L — AB (ref 14–54)
ANION GAP: 11 (ref 5–15)
AST: 84 U/L — ABNORMAL HIGH (ref 15–41)
BILIRUBIN TOTAL: 0.7 mg/dL (ref 0.3–1.2)
BUN: 9 mg/dL (ref 6–20)
CALCIUM: 8.4 mg/dL — AB (ref 8.9–10.3)
CO2: 26 mmol/L (ref 22–32)
CREATININE: 0.66 mg/dL (ref 0.44–1.00)
Chloride: 102 mmol/L (ref 101–111)
GFR calc non Af Amer: >60 mL/min (ref >60)
GLUCOSE: 154 mg/dL — AB (ref 65–99)
Potassium: 3.2 mmol/L — ABNORMAL LOW (ref 3.5–5.1)
Sodium: 139 mmol/L (ref 135–145)
TOTAL PROTEIN: 6.4 g/dL — AB (ref 6.5–8.1)

## 2015-08-29 LAB — MAGNESIUM: Magnesium: 2.1 mg/dL (ref 1.7–2.4)

## 2015-08-29 LAB — PROTIME-INR
INR: 1.19 (ref 0.00–1.49)
PROTHROMBIN TIME: 14.9 s (ref 11.6–15.2)

## 2015-08-29 MED ORDER — FLEET ENEMA 7-19 GM/118ML RE ENEM
1.0000 | ENEMA | Freq: Every day | RECTAL | Status: DC | PRN
  Administered 2015-08-29: 1 via RECTAL
  Filled 2015-08-29: qty 1

## 2015-08-29 MED ORDER — ONDANSETRON 4 MG PO TBDP: 4.0000 mg | ORAL_TABLET | Freq: Four times a day (QID) | ORAL | Status: AC | PRN

## 2015-08-29 MED ORDER — OXYCODONE HCL 5 MG PO TABS: 2.5000 mg | ORAL_TABLET | ORAL | Status: AC | PRN

## 2015-08-29 MED ORDER — BISACODYL 5 MG PO TBEC
5.0000 mg | DELAYED_RELEASE_TABLET | Freq: Once | ORAL | Status: AC
Start: 2015-08-29 — End: 2015-08-29
  Administered 2015-08-29: 5 mg via ORAL
  Filled 2015-08-29: qty 1

## 2015-08-29 MED ORDER — ACETAMINOPHEN 500 MG PO TABS: 1000.0000 mg | ORAL_TABLET | Freq: Four times a day (QID) | ORAL | Status: AC | PRN

## 2015-08-29 MED ORDER — BISACODYL 10 MG RE SUPP
10.0000 mg | Freq: Once | RECTAL | Status: AC
  Administered 2015-08-29: 10 mg via RECTAL
  Filled 2015-08-29: qty 1

## 2015-08-29 NOTE — Discharge Instructions (Signed)
[facility] Office Phone Number [phone]   POST OP INSTRUCTIONS  Always review your discharge instruction sheet given to you by the facility where your surgery was performed.  IF YOU HAVE DISABILITY OR FAMILY LEAVE FORMS, YOU MUST BRING THEM TO THE OFFICE FOR PROCESSING.  DO NOT GIVE THEM TO YOUR DOCTOR.  1. A prescription for pain medication may be given to you upon discharge.  Take your pain medication as prescribed, if needed.  If narcotic pain medicine is not needed, then you may take acetaminophen (Tylenol) or ibuprofen (Advil) as needed. 2. Take your usually prescribed medications unless otherwise directed 3. If you need a refill on your pain medication, please contact your pharmacy.  They will contact our office to request authorization.  Prescriptions will not be filled after 5pm or on week-ends. 4. You should eat very light the first 24 hours after surgery, such as soup, crackers, pudding, etc.  Resume your normal diet the day after surgery 5. It is common to experience some constipation if taking pain medication after surgery.  Increasing fluid intake and taking a stool softener will usually help or prevent this problem from occurring.  A mild laxative (Milk of Magnesia or Miralax) should be taken according to package directions if there are no bowel movements after 48 hours. 6. You may shower in 48 hours.  The surgical glue will flake off in 2-3 weeks.   7. ACTIVITIES:  No strenuous activity or heavy lifting for 2 weeks a. You may drive when you no longer are taking prescription pain medication, you can comfortably wear a seatbelt, and you can safely maneuver your car and apply brakes. b. RETURN TO WORK:  __________n/a_______________ You should see your doctor in the office for a follow-up appointment approximately three-four weeks after your surgery.    WHEN TO CALL YOUR DOCTOR: 1. Fever over 101.0 2. Nausea and/or vomiting. 3. Extreme swelling or  bruising. 4. Continued bleeding from incision. 5. Increased pain, redness, or drainage from the incision.  The clinic staff is available to answer your questions during regular business hours.  Please don't hesitate to call and ask to speak to one of the nurses for clinical concerns.  If you have a medical emergency, go to the nearest emergency room or call 911.  A surgeon from [facility] is always on call at the hospital.  For further questions, please visit [email or web address]   

## 2015-08-29 NOTE — Progress Notes (Signed)
1 Day Post-Op  Subjective: Doing well, minimal pain.  Having some flatus.  No n/v.  Taking tylenol primarily.    Objective: Vital signs in last 24 hours: Temp:  [97.3 F (36.3 C)-98.8 F (37.1 C)] 97.8 F (36.6 C) (02/08 0523) Pulse Rate:  [66-84] 69 (02/08 0523) Resp:  [8-19] 18 (02/08 0523) BP: (104-166)/(50-77) 111/51 mmHg (02/08 0523) SpO2:  [93 %-100 %] 100 % (02/08 0523) Last BM Date: 08/27/15  Intake/Output from previous day: 02/07 0701 - 02/08 0700 In: 3118.8 [P.O.:720; I.V.:2398.8] Out: [id number] [Urine:4275; Blood:100] Intake/Output this shift:    General appearance: alert, cooperative and no distress Resp: breathing comfortably GI: soft, non distended, approp tender at incisions.  Bruising at umbilical incision.    Lab Results:   Recent Labs  08/27/15 0920 08/29/15 0518  WBC 6.9 14.1*  HGB 14.3 13.2  HCT 41.4 38.4  PLT 192 198   BMET  Recent Labs  08/27/15 0920 08/29/15 0518  NA 141 139  K 3.9 3.2*  CL 99* 102  CO2 31 26  GLUCOSE 88 154*  BUN 14 9  CREATININE 0.75 0.66  CALCIUM 9.6 8.4*   PT/INR  Recent Labs  08/27/15 0920 08/29/15 0518  LABPROT 13.8 14.9  INR 1.04 1.19   ABG No results for input(s): PHART, HCO3 in the last 72 hours.  Invalid input(s): PCO2, PO2  Studies/Results: Dg Chest 2 View  08/27/2015  CLINICAL DATA:  Preop surgery, sister mobile firm liver. History of hypertension, GERD, pneumonia and bronchitis. EXAM: CHEST  2 VIEW COMPARISON:  Chest x-ray dated 12/31/2010. FINDINGS: Heart size is normal. Overall cardiomediastinal silhouette is stable in size and configuration. Lungs are clear. Lung volumes are normal. Osseous and soft tissue structures about the chest are unremarkable. Mild levoscoliosis noted within the thoracic spine. IMPRESSION: Lungs are clear and there is no evidence of acute cardiopulmonary abnormality Electronically Signed   By: Franki Cabot M.D.   On: 08/27/2015 09:54    Anti-infectives: Anti-infectives     Start     Dose/Rate Route Frequency Ordered Stop   08/28/15 2000  ciprofloxacin (CIPRO) IVPB 400 mg     400 mg 200 mL/hr over 60 Minutes Intravenous Every 12 hours 08/28/15 1130 08/28/15 2142   08/28/15 0620  ciprofloxacin (CIPRO) IVPB 400 mg     400 mg 200 mL/hr over 60 Minutes Intravenous On call to O.R. 08/28/15 [id number] 08/28/15 0752      Assessment/Plan: s/p Procedure(s): LAPAROSCOPIC PARTIAL HEPATECTOMY (N/A) Advance diet Ambulate  Laxative Home later today if mobilizes better and able to tolerate diet.    [facility] 08/29/2015

## 2015-08-29 NOTE — Discharge Summary (Signed)
Physician Discharge Summary  Patient ID: [name] MRN: [id number] DOB/AGE: [birth year] 82 y.o.  Admit date: 08/28/2015 Discharge date: 08/30/2015  Admission Diagnoses: Patient Active Problem List   Diagnosis Date Noted  . Hepatic cyst 08/28/2015    Discharge Diagnoses:  Active Problems:   Hepatic cyst   Discharged Condition: stable  Hospital Course: Pt was admitted to the floor following resection of left lateral segment liver cyst.  She did well overnight.  She had good pain control.  She did have a bit of nausea the first night, and needed the second day to be able to pick up her mobility and diet adequately.  She was able to ambulate independently and void spontaneously.  She was passing gas.  She transitioned to oral analgesics and was discharged to home in stable condition.    Consults: None  Significant Diagnostic Studies: labs: minimal drop in HCT.   Treatments: surgery: see above  Discharge Exam: Blood pressure 117/57, pulse 69, temperature 98.2 F (36.8 C), temperature source Oral, resp. rate 16, height 5\' 2"  (1.575 m), weight 59.676 kg (131 lb 9 oz), SpO2 97 %. General appearance: alert, cooperative and no distress Resp: breathing comfortably Cardio: regular rate and rhythm GI: soft, non distended, approp tender at incisions.  Disposition: 01-Home or Self Care      Discharge Instructions    Call MD for:  persistant nausea and vomiting    Complete by:  As directed      Call MD for:  redness, tenderness, or signs of infection (pain, swelling, redness, odor or green/yellow discharge around incision site)    Complete by:  As directed      Call MD for:  severe uncontrolled pain    Complete by:  As directed      Call MD for:  temperature >100.4    Complete by:  As directed      Diet - low sodium heart healthy    Complete by:  As directed      Increase activity slowly    Complete by:  As directed             Medication List    TAKE these medications       acetaminophen 500 MG tablet  Commonly known as:  TYLENOL  Take 2 tablets (1,000 mg total) by mouth every 6 (six) hours as needed for mild pain or moderate pain.     omeprazole 20 MG capsule  Commonly known as:  PRILOSEC  Take 20 mg by mouth daily.     ondansetron 4 MG disintegrating tablet  Commonly known as:  ZOFRAN-ODT  Take 1 tablet (4 mg total) by mouth every 6 (six) hours as needed for nausea.     ondansetron 4 MG tablet  Commonly known as:  ZOFRAN  Take 4 mg by mouth every 12 (twelve) hours as needed for nausea or vomiting.     oxyCODONE 5 MG immediate release tablet  Commonly known as:  Oxy IR/ROXICODONE  Take 0.5-1 tablets (2.5-5 mg total) by mouth every 4 (four) hours as needed for moderate pain.     potassium chloride SA 20 MEQ tablet  Commonly known as:  K-DUR,KLOR-CON  Take 2 tablets (40 mEq total) by mouth 2 (two) times daily.     promethazine 12.5 MG tablet  Commonly known as:  PHENERGAN  Take 1 tablet (12.5 mg total) by mouth every 6 (six) hours as needed for nausea or vomiting.     sodium chloride 0.65 %  Soln nasal spray  Commonly known as:  OCEAN  Place 1 spray into both nostrils daily as needed (dry nasal passages).     triamterene-hydrochlorothiazide 37.5-25 MG capsule  Commonly known as:  DYAZIDE  Take 1 capsule by mouth daily.       Follow-up Information    Follow up with [facility], MD In 2 weeks.   Specialty:  General Surgery   Contact information:   [address] [place] [address] [phone]       Signed: Stark Klein 08/30/2015, 7:38 AM

## 2015-08-30 DIAGNOSIS — K7689 Other specified diseases of liver: Secondary | ICD-10-CM | POA: Diagnosis not present

## 2015-08-30 LAB — COMPREHENSIVE METABOLIC PANEL
ALBUMIN: 3.6 g/dL (ref 3.5–5.0)
ALK PHOS: 45 U/L (ref 38–126)
ALT: 75 U/L — AB (ref 14–54)
ANION GAP: 10 (ref 5–15)
AST: 45 U/L — AB (ref 15–41)
BILIRUBIN TOTAL: 0.7 mg/dL (ref 0.3–1.2)
BUN: 13 mg/dL (ref 6–20)
CO2: 29 mmol/L (ref 22–32)
CREATININE: 0.67 mg/dL (ref 0.44–1.00)
Calcium: 8.4 mg/dL — ABNORMAL LOW (ref 8.9–10.3)
Chloride: 104 mmol/L (ref 101–111)
GFR calc Af Amer: >60 mL/min (ref >60)
GFR calc non Af Amer: >60 mL/min (ref >60)
GLUCOSE: 102 mg/dL — AB (ref 65–99)
Potassium: 2.8 mmol/L — ABNORMAL LOW (ref 3.5–5.1)
SODIUM: 143 mmol/L (ref 135–145)
TOTAL PROTEIN: 6.3 g/dL — AB (ref 6.5–8.1)

## 2015-08-30 LAB — CBC
HEMATOCRIT: 37.6 % (ref 36.0–46.0)
HEMOGLOBIN: 12.8 g/dL (ref 12.0–15.0)
MCH: 29.6 pg (ref 26.0–34.0)
MCHC: 34 g/dL (ref 30.0–36.0)
MCV: 87 fL (ref 78.0–100.0)
Platelets: 169 10*3/uL (ref 150–400)
RBC: 4.32 MIL/uL (ref 3.87–5.11)
RDW: 14.2 % (ref 11.5–15.5)
WBC: 9.8 10*3/uL (ref 4.0–10.5)

## 2015-08-30 MED ORDER — POTASSIUM CHLORIDE CRYS ER 20 MEQ PO TBCR
40.0000 meq | EXTENDED_RELEASE_TABLET | Freq: Once | ORAL | Status: DC
  Filled 2015-08-30: qty 2

## 2015-08-30 MED ORDER — POTASSIUM CHLORIDE CRYS ER 20 MEQ PO TBCR: 40.0000 meq | EXTENDED_RELEASE_TABLET | Freq: Two times a day (BID) | ORAL | Status: AC

## 2015-08-30 MED ORDER — POTASSIUM CHLORIDE CRYS ER 20 MEQ PO TBCR
40.0000 meq | EXTENDED_RELEASE_TABLET | Freq: Two times a day (BID) | ORAL | Status: DC
  Filled 2015-08-30: qty 2

## 2015-09-03 NOTE — Progress Notes (Signed)
Quick Note:  Please let patient know pathology is benign. ______ 

## 2015-09-17 DIAGNOSIS — E876 Hypokalemia: Secondary | ICD-10-CM | POA: Diagnosis not present

## 2015-10-14 DIAGNOSIS — J069 Acute upper respiratory infection, unspecified: Secondary | ICD-10-CM | POA: Diagnosis not present

## 2015-11-22 DIAGNOSIS — R739 Hyperglycemia, unspecified: Secondary | ICD-10-CM | POA: Diagnosis not present

## 2015-11-22 DIAGNOSIS — M859 Disorder of bone density and structure, unspecified: Secondary | ICD-10-CM | POA: Diagnosis not present

## 2015-11-22 DIAGNOSIS — I10 Essential (primary) hypertension: Secondary | ICD-10-CM | POA: Diagnosis not present

## 2015-11-22 DIAGNOSIS — E781 Pure hyperglyceridemia: Secondary | ICD-10-CM | POA: Diagnosis not present

## 2015-11-29 DIAGNOSIS — R7309 Other abnormal glucose: Secondary | ICD-10-CM | POA: Diagnosis not present

## 2015-11-29 DIAGNOSIS — K760 Fatty (change of) liver, not elsewhere classified: Secondary | ICD-10-CM | POA: Diagnosis not present

## 2015-11-29 DIAGNOSIS — K7689 Other specified diseases of liver: Secondary | ICD-10-CM | POA: Diagnosis not present

## 2015-11-29 DIAGNOSIS — Z1389 Encounter for screening for other disorder: Secondary | ICD-10-CM | POA: Diagnosis not present

## 2015-11-29 DIAGNOSIS — Z6823 Body mass index (BMI) 23.0-23.9, adult: Secondary | ICD-10-CM | POA: Diagnosis not present

## 2015-11-29 DIAGNOSIS — K589 Irritable bowel syndrome without diarrhea: Secondary | ICD-10-CM | POA: Diagnosis not present

## 2015-11-29 DIAGNOSIS — E781 Pure hyperglyceridemia: Secondary | ICD-10-CM | POA: Diagnosis not present

## 2015-11-29 DIAGNOSIS — K660 Peritoneal adhesions (postprocedural) (postinfection): Secondary | ICD-10-CM | POA: Diagnosis not present

## 2015-11-29 DIAGNOSIS — M5030 Other cervical disc degeneration, unspecified cervical region: Secondary | ICD-10-CM | POA: Diagnosis not present

## 2015-11-29 DIAGNOSIS — E876 Hypokalemia: Secondary | ICD-10-CM | POA: Diagnosis not present

## 2015-11-29 DIAGNOSIS — J449 Chronic obstructive pulmonary disease, unspecified: Secondary | ICD-10-CM | POA: Diagnosis not present

## 2015-11-29 DIAGNOSIS — Z0000 Encounter for general adult medical examination without abnormal findings: Secondary | ICD-10-CM | POA: Diagnosis not present

## 2015-12-25 DIAGNOSIS — E559 Vitamin D deficiency, unspecified: Secondary | ICD-10-CM | POA: Diagnosis not present

## 2015-12-25 DIAGNOSIS — M859 Disorder of bone density and structure, unspecified: Secondary | ICD-10-CM | POA: Diagnosis not present

## 2016-02-18 DIAGNOSIS — K5909 Other constipation: Secondary | ICD-10-CM | POA: Diagnosis not present

## 2016-04-25 DIAGNOSIS — Z23 Encounter for immunization: Secondary | ICD-10-CM | POA: Diagnosis not present

## 2016-06-04 DIAGNOSIS — Z1231 Encounter for screening mammogram for malignant neoplasm of breast: Secondary | ICD-10-CM | POA: Diagnosis not present

## 2016-08-05 DIAGNOSIS — Z961 Presence of intraocular lens: Secondary | ICD-10-CM | POA: Diagnosis not present

## 2016-08-05 DIAGNOSIS — H5203 Hypermetropia, bilateral: Secondary | ICD-10-CM | POA: Diagnosis not present

## 2016-08-08 ENCOUNTER — Encounter (HOSPITAL_COMMUNITY): Payer: Self-pay | Admitting: *Deleted

## 2016-08-08 ENCOUNTER — Ambulatory Visit (HOSPITAL_COMMUNITY)
Admission: EM | Admit: 2016-08-08 | Discharge: 2016-08-08 | Disposition: A | Discharge status: Home / Self Care | Payer: Medicare Other | Attending: Family Medicine | Admitting: Family Medicine

## 2016-08-08 DIAGNOSIS — J209 Acute bronchitis, unspecified: Secondary | ICD-10-CM

## 2016-08-08 DIAGNOSIS — R05 Cough: Secondary | ICD-10-CM | POA: Diagnosis not present

## 2016-08-08 DIAGNOSIS — R059 Cough, unspecified: Secondary | ICD-10-CM

## 2016-08-08 MED ORDER — BENZONATATE 100 MG PO CAPS: 100.0000 mg | ORAL_CAPSULE | Freq: Three times a day (TID) | ORAL | 0 refills | Status: AC

## 2016-08-08 MED ORDER — AZITHROMYCIN 250 MG PO TABS: 250.0000 mg | ORAL_TABLET | Freq: Every day | ORAL | 0 refills | Status: AC

## 2016-08-08 NOTE — ED Notes (Signed)
Pt    Reports   Symptoms  Of   Sinus  Drainage  As   Well  As  Headache           And  Pain in  Her  Chest  When  She  Coughs

## 2016-08-08 NOTE — ED Provider Notes (Signed)
CSN: [id number]     Arrival date & time 08/08/16  1207 History   First MD Initiated Contact with Patient 08/08/16 1407     Chief Complaint  Patient presents with  . Cough   (Consider location/radiation/quality/duration/timing/severity/associated sxs/prior Treatment) C/o uri sx's and cough for 5 days.   The history is provided by the patient.  URI  Presenting symptoms: congestion and cough   Severity:  Moderate Onset quality:  Sudden Duration:  5 days Timing:  Constant Progression:  Worsening Chronicity:  New Relieved by:  Nothing Worsened by:  Nothing   Past Medical History:  Diagnosis Date  . Arthritis   . Bowel obstruction   . Constipation   . GERD (gastroesophageal reflux disease)   . History of blood transfusion   . History of bronchitis   . History of hiatal hernia   . History of kidney stones   . Hypertension   . Pneumonia   . Wears glasses    Past Surgical History:  Procedure Laterality Date  . ABDOMINAL HYSTERECTOMY    . ABDOMINAL SURGERY    . APPENDECTOMY    . BREAST SURGERY    . EYE SURGERY     cataract surgery bilat with lens implants  . LAPAROSCOPIC HEPATECTOMY N/A 08/28/2015   Procedure: LAPAROSCOPIC PARTIAL HEPATECTOMY;  Surgeon: Stark Klein, MD;  Location: [facility];  Service: General;  Laterality: N/A;  . OOPHORECTOMY     History reviewed. No pertinent family history. Social History  Substance Use Topics  . Smoking status: Never Smoker  . Smokeless tobacco: Never Used  . Alcohol use No   OB History    No data available     Review of Systems  HENT: Positive for congestion.   Eyes: Negative.   Respiratory: Positive for cough.   Cardiovascular: Negative.   Gastrointestinal: Negative.   Endocrine: Negative.   Genitourinary: Negative.   Musculoskeletal: Negative.   Allergic/Immunologic: Negative.   Neurological: Negative.   Hematological: Negative.   Psychiatric/Behavioral: Negative.     Allergies  Keflex [cephalexin] and Demerol  [meperidine]  Home Medications   Prior to Admission medications   Medication Sig Start Date End Date Taking? Authorizing Provider  acetaminophen (TYLENOL) 500 MG tablet Take 2 tablets (1,000 mg total) by mouth every 6 (six) hours as needed for mild pain or moderate pain. 08/29/15   Stark Klein, MD  azithromycin (ZITHROMAX) 250 MG tablet Take 1 tablet (250 mg total) by mouth daily. Take first 2 tablets together, then 1 every day until finished. 08/08/16   Lysbeth Penner, FNP  benzonatate (TESSALON) 100 MG capsule Take 1 capsule (100 mg total) by mouth every 8 (eight) hours. 08/08/16   Lysbeth Penner, FNP  omeprazole (PRILOSEC) 20 MG capsule Take 20 mg by mouth daily. 08/11/15   Historical Provider, MD  ondansetron (ZOFRAN) 4 MG tablet Take 4 mg by mouth every 12 (twelve) hours as needed for nausea or vomiting.    Historical Provider, MD  ondansetron (ZOFRAN-ODT) 4 MG disintegrating tablet Take 1 tablet (4 mg total) by mouth every 6 (six) hours as needed for nausea. 08/29/15   Stark Klein, MD  oxyCODONE (OXY IR/ROXICODONE) 5 MG immediate release tablet Take 0.5-1 tablets (2.5-5 mg total) by mouth every 4 (four) hours as needed for moderate pain. 08/29/15   Stark Klein, MD  potassium chloride SA (K-DUR,KLOR-CON) 20 MEQ tablet Take 2 tablets (40 mEq total) by mouth 2 (two) times daily. 08/30/15   Stark Klein, MD  promethazine (PHENERGAN)  12.5 MG tablet Take 1 tablet (12.5 mg total) by mouth every 6 (six) hours as needed for nausea or vomiting. 07/25/15   Shawn C Joy, PA-C  sodium chloride (OCEAN) 0.65 % SOLN nasal spray Place 1 spray into both nostrils daily as needed (dry nasal passages).    Historical Provider, MD  triamterene-hydrochlorothiazide (DYAZIDE) 37.5-25 MG capsule Take 1 capsule by mouth daily. 08/11/15   Historical Provider, MD   Meds Ordered and Administered this Visit  Medications - No data to display  BP 142/78 (BP Location: Right Arm)   Pulse 78   Temp 97.9 F (36.6 C) (Oral)   Resp  18   SpO2 98%  No data found.   Physical Exam  Constitutional: She is oriented to person, place, and time. She appears well-developed and well-nourished.  HENT:  Head: Normocephalic and atraumatic.  Right Ear: External ear normal.  Left Ear: External ear normal.  Mouth/Throat: Oropharynx is clear and moist.  Eyes: Conjunctivae and EOM are normal. Pupils are equal, round, and reactive to light.  Neck: Normal range of motion. Neck supple.  Cardiovascular: Normal rate, regular rhythm and normal heart sounds.   Pulmonary/Chest: Effort normal and breath sounds normal.  Abdominal: Soft. Bowel sounds are normal.  Neurological: She is alert and oriented to person, place, and time.  Nursing note and vitals reviewed.   Urgent Care Course     Procedures (including critical care time)  Labs Review Labs Reviewed - No data to display  Imaging Review No results found.   Visual Acuity Review  Right Eye Distance:   Left Eye Distance:   Bilateral Distance:    Right Eye Near:   Left Eye Near:    Bilateral Near:         MDM   1. Acute bronchitis, unspecified organism   2. Cough    Zpak Tessalon perles Push po fluids, rest, tylenol and motrin otc prn as directed for fever, arthralgias, and myalgias.  Follow up prn if sx's continue or persist.    Lysbeth Penner, FNP 08/08/16 [phone]

## 2016-08-08 NOTE — ED Triage Notes (Signed)
5  Days  Of  coughing   And   Sneezing    Symptoms   Not  releived by  otc    meds   And  Left  Over  Cough  meds

## 2016-08-13 DIAGNOSIS — J209 Acute bronchitis, unspecified: Secondary | ICD-10-CM | POA: Diagnosis not present

## 2016-11-24 DIAGNOSIS — E781 Pure hyperglyceridemia: Secondary | ICD-10-CM | POA: Diagnosis not present

## 2016-11-24 DIAGNOSIS — R7309 Other abnormal glucose: Secondary | ICD-10-CM | POA: Diagnosis not present

## 2016-11-24 DIAGNOSIS — M859 Disorder of bone density and structure, unspecified: Secondary | ICD-10-CM | POA: Diagnosis not present

## 2016-11-24 DIAGNOSIS — I10 Essential (primary) hypertension: Secondary | ICD-10-CM | POA: Diagnosis not present

## 2016-12-01 DIAGNOSIS — M859 Disorder of bone density and structure, unspecified: Secondary | ICD-10-CM | POA: Diagnosis not present

## 2016-12-01 DIAGNOSIS — R159 Full incontinence of feces: Secondary | ICD-10-CM | POA: Diagnosis not present

## 2016-12-01 DIAGNOSIS — E781 Pure hyperglyceridemia: Secondary | ICD-10-CM | POA: Diagnosis not present

## 2016-12-01 DIAGNOSIS — Z6823 Body mass index (BMI) 23.0-23.9, adult: Secondary | ICD-10-CM | POA: Diagnosis not present

## 2016-12-01 DIAGNOSIS — J449 Chronic obstructive pulmonary disease, unspecified: Secondary | ICD-10-CM | POA: Diagnosis not present

## 2016-12-01 DIAGNOSIS — Z1389 Encounter for screening for other disorder: Secondary | ICD-10-CM | POA: Diagnosis not present

## 2016-12-01 DIAGNOSIS — R7309 Other abnormal glucose: Secondary | ICD-10-CM | POA: Diagnosis not present

## 2016-12-01 DIAGNOSIS — K760 Fatty (change of) liver, not elsewhere classified: Secondary | ICD-10-CM | POA: Diagnosis not present

## 2016-12-01 DIAGNOSIS — I10 Essential (primary) hypertension: Secondary | ICD-10-CM | POA: Diagnosis not present

## 2016-12-01 DIAGNOSIS — Z0000 Encounter for general adult medical examination without abnormal findings: Secondary | ICD-10-CM | POA: Diagnosis not present

## 2016-12-01 DIAGNOSIS — E559 Vitamin D deficiency, unspecified: Secondary | ICD-10-CM | POA: Diagnosis not present

## 2016-12-01 DIAGNOSIS — L408 Other psoriasis: Secondary | ICD-10-CM | POA: Diagnosis not present

## 2016-12-02 DIAGNOSIS — Z1212 Encounter for screening for malignant neoplasm of rectum: Secondary | ICD-10-CM | POA: Diagnosis not present

## 2017-05-05 DIAGNOSIS — Z23 Encounter for immunization: Secondary | ICD-10-CM | POA: Diagnosis not present

## 2017-05-19 DIAGNOSIS — K5909 Other constipation: Secondary | ICD-10-CM | POA: Diagnosis not present

## 2017-06-03 DIAGNOSIS — L400 Psoriasis vulgaris: Secondary | ICD-10-CM | POA: Diagnosis not present

## 2017-06-05 DIAGNOSIS — Z1231 Encounter for screening mammogram for malignant neoplasm of breast: Secondary | ICD-10-CM | POA: Diagnosis not present

## 2017-07-23 DIAGNOSIS — Z23 Encounter for immunization: Secondary | ICD-10-CM | POA: Diagnosis not present

## 2017-07-23 DIAGNOSIS — L400 Psoriasis vulgaris: Secondary | ICD-10-CM | POA: Diagnosis not present

## 2017-08-27 DIAGNOSIS — J069 Acute upper respiratory infection, unspecified: Secondary | ICD-10-CM | POA: Diagnosis not present

## 2017-09-23 DIAGNOSIS — Z23 Encounter for immunization: Secondary | ICD-10-CM | POA: Diagnosis not present

## 2017-09-23 DIAGNOSIS — L400 Psoriasis vulgaris: Secondary | ICD-10-CM | POA: Diagnosis not present

## 2017-12-30 DIAGNOSIS — L400 Psoriasis vulgaris: Secondary | ICD-10-CM | POA: Diagnosis not present

## 2018-03-09 DIAGNOSIS — E781 Pure hyperglyceridemia: Secondary | ICD-10-CM | POA: Diagnosis not present

## 2018-03-09 DIAGNOSIS — I10 Essential (primary) hypertension: Secondary | ICD-10-CM | POA: Diagnosis not present

## 2018-03-09 DIAGNOSIS — R82998 Other abnormal findings in urine: Secondary | ICD-10-CM | POA: Diagnosis not present

## 2018-03-09 DIAGNOSIS — M859 Disorder of bone density and structure, unspecified: Secondary | ICD-10-CM | POA: Diagnosis not present

## 2018-03-09 DIAGNOSIS — R739 Hyperglycemia, unspecified: Secondary | ICD-10-CM | POA: Diagnosis not present

## 2018-03-09 DIAGNOSIS — Z0000 Encounter for general adult medical examination without abnormal findings: Secondary | ICD-10-CM | POA: Diagnosis not present

## 2018-03-16 DIAGNOSIS — M859 Disorder of bone density and structure, unspecified: Secondary | ICD-10-CM | POA: Diagnosis not present

## 2018-03-16 DIAGNOSIS — M1990 Unspecified osteoarthritis, unspecified site: Secondary | ICD-10-CM | POA: Diagnosis not present

## 2018-03-16 DIAGNOSIS — K760 Fatty (change of) liver, not elsewhere classified: Secondary | ICD-10-CM | POA: Diagnosis not present

## 2018-03-16 DIAGNOSIS — J449 Chronic obstructive pulmonary disease, unspecified: Secondary | ICD-10-CM | POA: Diagnosis not present

## 2018-03-16 DIAGNOSIS — E781 Pure hyperglyceridemia: Secondary | ICD-10-CM | POA: Diagnosis not present

## 2018-03-16 DIAGNOSIS — R159 Full incontinence of feces: Secondary | ICD-10-CM | POA: Diagnosis not present

## 2018-03-16 DIAGNOSIS — Z1389 Encounter for screening for other disorder: Secondary | ICD-10-CM | POA: Diagnosis not present

## 2018-03-16 DIAGNOSIS — Z6822 Body mass index (BMI) 22.0-22.9, adult: Secondary | ICD-10-CM | POA: Diagnosis not present

## 2018-03-16 DIAGNOSIS — L408 Other psoriasis: Secondary | ICD-10-CM | POA: Diagnosis not present

## 2018-03-16 DIAGNOSIS — I10 Essential (primary) hypertension: Secondary | ICD-10-CM | POA: Diagnosis not present

## 2018-03-16 DIAGNOSIS — Z0000 Encounter for general adult medical examination without abnormal findings: Secondary | ICD-10-CM | POA: Diagnosis not present

## 2018-03-16 DIAGNOSIS — E559 Vitamin D deficiency, unspecified: Secondary | ICD-10-CM | POA: Diagnosis not present

## 2018-03-17 DIAGNOSIS — Z23 Encounter for immunization: Secondary | ICD-10-CM | POA: Diagnosis not present

## 2018-03-23 DIAGNOSIS — L400 Psoriasis vulgaris: Secondary | ICD-10-CM | POA: Diagnosis not present

## 2018-03-23 DIAGNOSIS — B078 Other viral warts: Secondary | ICD-10-CM | POA: Diagnosis not present

## 2018-03-23 DIAGNOSIS — Z1212 Encounter for screening for malignant neoplasm of rectum: Secondary | ICD-10-CM | POA: Diagnosis not present

## 2018-03-23 DIAGNOSIS — L820 Inflamed seborrheic keratosis: Secondary | ICD-10-CM | POA: Diagnosis not present

## 2018-03-29 DIAGNOSIS — M859 Disorder of bone density and structure, unspecified: Secondary | ICD-10-CM | POA: Diagnosis not present

## 2018-05-10 DIAGNOSIS — Z23 Encounter for immunization: Secondary | ICD-10-CM | POA: Diagnosis not present

## 2018-05-10 DIAGNOSIS — C44729 Squamous cell carcinoma of skin of left lower limb, including hip: Secondary | ICD-10-CM | POA: Diagnosis not present

## 2018-05-10 DIAGNOSIS — L400 Psoriasis vulgaris: Secondary | ICD-10-CM | POA: Diagnosis not present

## 2018-05-10 DIAGNOSIS — D485 Neoplasm of uncertain behavior of skin: Secondary | ICD-10-CM | POA: Diagnosis not present

## 2018-06-07 DIAGNOSIS — Z1231 Encounter for screening mammogram for malignant neoplasm of breast: Secondary | ICD-10-CM | POA: Diagnosis not present

## 2018-06-24 DIAGNOSIS — C44729 Squamous cell carcinoma of skin of left lower limb, including hip: Secondary | ICD-10-CM | POA: Diagnosis not present

## 2018-06-24 DIAGNOSIS — B079 Viral wart, unspecified: Secondary | ICD-10-CM | POA: Diagnosis not present

## 2018-06-24 DIAGNOSIS — L905 Scar conditions and fibrosis of skin: Secondary | ICD-10-CM | POA: Diagnosis not present

## 2018-06-24 DIAGNOSIS — D485 Neoplasm of uncertain behavior of skin: Secondary | ICD-10-CM | POA: Diagnosis not present

## 2018-08-05 DIAGNOSIS — H524 Presbyopia: Secondary | ICD-10-CM | POA: Diagnosis not present

## 2018-08-05 DIAGNOSIS — Z961 Presence of intraocular lens: Secondary | ICD-10-CM | POA: Diagnosis not present

## 2018-08-17 DIAGNOSIS — K210 Gastro-esophageal reflux disease with esophagitis: Secondary | ICD-10-CM | POA: Diagnosis not present

## 2018-08-17 DIAGNOSIS — J370 Chronic laryngitis: Secondary | ICD-10-CM | POA: Diagnosis not present

## 2018-08-17 DIAGNOSIS — J320 Chronic maxillary sinusitis: Secondary | ICD-10-CM | POA: Diagnosis not present

## 2018-08-17 DIAGNOSIS — J322 Chronic ethmoidal sinusitis: Secondary | ICD-10-CM | POA: Diagnosis not present

## 2018-08-17 DIAGNOSIS — R05 Cough: Secondary | ICD-10-CM | POA: Diagnosis not present

## 2018-09-13 DIAGNOSIS — L400 Psoriasis vulgaris: Secondary | ICD-10-CM | POA: Diagnosis not present

## 2018-09-13 DIAGNOSIS — Z23 Encounter for immunization: Secondary | ICD-10-CM | POA: Diagnosis not present

## 2018-11-22 DIAGNOSIS — L821 Other seborrheic keratosis: Secondary | ICD-10-CM | POA: Diagnosis not present

## 2018-11-22 DIAGNOSIS — L400 Psoriasis vulgaris: Secondary | ICD-10-CM | POA: Diagnosis not present

## 2018-11-22 DIAGNOSIS — L820 Inflamed seborrheic keratosis: Secondary | ICD-10-CM | POA: Diagnosis not present

## 2019-03-14 DIAGNOSIS — M859 Disorder of bone density and structure, unspecified: Secondary | ICD-10-CM | POA: Diagnosis not present

## 2019-03-14 DIAGNOSIS — R739 Hyperglycemia, unspecified: Secondary | ICD-10-CM | POA: Diagnosis not present

## 2019-03-14 DIAGNOSIS — Z23 Encounter for immunization: Secondary | ICD-10-CM | POA: Diagnosis not present

## 2019-03-14 DIAGNOSIS — E781 Pure hyperglyceridemia: Secondary | ICD-10-CM | POA: Diagnosis not present

## 2019-03-17 DIAGNOSIS — R82998 Other abnormal findings in urine: Secondary | ICD-10-CM | POA: Diagnosis not present

## 2019-03-18 DIAGNOSIS — I10 Essential (primary) hypertension: Secondary | ICD-10-CM | POA: Diagnosis not present

## 2019-03-21 DIAGNOSIS — E781 Pure hyperglyceridemia: Secondary | ICD-10-CM | POA: Diagnosis not present

## 2019-03-21 DIAGNOSIS — J449 Chronic obstructive pulmonary disease, unspecified: Secondary | ICD-10-CM | POA: Diagnosis not present

## 2019-03-21 DIAGNOSIS — K760 Fatty (change of) liver, not elsewhere classified: Secondary | ICD-10-CM | POA: Diagnosis not present

## 2019-03-21 DIAGNOSIS — R1084 Generalized abdominal pain: Secondary | ICD-10-CM | POA: Diagnosis not present

## 2019-03-21 DIAGNOSIS — Z0000 Encounter for general adult medical examination without abnormal findings: Secondary | ICD-10-CM | POA: Diagnosis not present

## 2019-03-21 DIAGNOSIS — K589 Irritable bowel syndrome without diarrhea: Secondary | ICD-10-CM | POA: Diagnosis not present

## 2019-03-21 DIAGNOSIS — R739 Hyperglycemia, unspecified: Secondary | ICD-10-CM | POA: Diagnosis not present

## 2019-03-21 DIAGNOSIS — R159 Full incontinence of feces: Secondary | ICD-10-CM | POA: Diagnosis not present

## 2019-03-21 DIAGNOSIS — L409 Psoriasis, unspecified: Secondary | ICD-10-CM | POA: Diagnosis not present

## 2019-03-21 DIAGNOSIS — C4491 Basal cell carcinoma of skin, unspecified: Secondary | ICD-10-CM | POA: Diagnosis not present

## 2019-03-21 DIAGNOSIS — M8580 Other specified disorders of bone density and structure, unspecified site: Secondary | ICD-10-CM | POA: Diagnosis not present

## 2019-03-21 DIAGNOSIS — K5900 Constipation, unspecified: Secondary | ICD-10-CM | POA: Diagnosis not present

## 2019-04-13 DIAGNOSIS — Z1212 Encounter for screening for malignant neoplasm of rectum: Secondary | ICD-10-CM | POA: Diagnosis not present

## 2019-04-18 DIAGNOSIS — L820 Inflamed seborrheic keratosis: Secondary | ICD-10-CM | POA: Diagnosis not present

## 2019-04-18 DIAGNOSIS — L400 Psoriasis vulgaris: Secondary | ICD-10-CM | POA: Diagnosis not present

## 2019-06-02 DIAGNOSIS — L400 Psoriasis vulgaris: Secondary | ICD-10-CM | POA: Diagnosis not present

## 2019-06-02 DIAGNOSIS — L821 Other seborrheic keratosis: Secondary | ICD-10-CM | POA: Diagnosis not present

## 2019-06-09 DIAGNOSIS — Z1231 Encounter for screening mammogram for malignant neoplasm of breast: Secondary | ICD-10-CM | POA: Diagnosis not present

## 2019-06-29 ENCOUNTER — Emergency Department (HOSPITAL_COMMUNITY): Payer: Medicare Other

## 2019-06-29 ENCOUNTER — Other Ambulatory Visit: Payer: Self-pay

## 2019-06-29 ENCOUNTER — Encounter (HOSPITAL_COMMUNITY): Payer: Self-pay | Admitting: Emergency Medicine

## 2019-06-29 ENCOUNTER — Emergency Department (HOSPITAL_COMMUNITY)
Admission: EM | Admit: 2019-06-29 | Discharge: 2019-06-29 | Disposition: A | Discharge status: Home / Self Care | Payer: Medicare Other | Attending: Emergency Medicine | Admitting: Emergency Medicine

## 2019-06-29 DIAGNOSIS — R42 Dizziness and giddiness: Secondary | ICD-10-CM | POA: Diagnosis not present

## 2019-06-29 DIAGNOSIS — H5500 Unspecified nystagmus: Secondary | ICD-10-CM | POA: Diagnosis not present

## 2019-06-29 DIAGNOSIS — I10 Essential (primary) hypertension: Secondary | ICD-10-CM | POA: Insufficient documentation

## 2019-06-29 DIAGNOSIS — Z79899 Other long term (current) drug therapy: Secondary | ICD-10-CM | POA: Insufficient documentation

## 2019-06-29 DIAGNOSIS — H81391 Other peripheral vertigo, right ear: Secondary | ICD-10-CM

## 2019-06-29 DIAGNOSIS — N3090 Cystitis, unspecified without hematuria: Secondary | ICD-10-CM | POA: Diagnosis not present

## 2019-06-29 LAB — BASIC METABOLIC PANEL
Anion gap: 10 (ref 5–15)
BUN: 16 mg/dL (ref 8–23)
CO2: 31 mmol/L (ref 22–32)
Calcium: 9 mg/dL (ref 8.9–10.3)
Chloride: 99 mmol/L (ref 98–111)
Creatinine, Ser: 0.77 mg/dL (ref 0.44–1.00)
GFR calc Af Amer: >60 mL/min (ref >60)
GFR calc non Af Amer: >60 mL/min (ref >60)
Glucose, Bld: 104 mg/dL — ABNORMAL HIGH (ref 70–99)
Potassium: 3.3 mmol/L — ABNORMAL LOW (ref 3.5–5.1)
Sodium: 140 mmol/L (ref 135–145)

## 2019-06-29 LAB — CBC
HCT: 42.7 % (ref 36.0–46.0)
Hemoglobin: 14.1 g/dL (ref 12.0–15.0)
MCH: 29.4 pg (ref 26.0–34.0)
MCHC: 33 g/dL (ref 30.0–36.0)
MCV: 89 fL (ref 80.0–100.0)
Platelets: 215 10*3/uL (ref 150–400)
RBC: 4.8 MIL/uL (ref 3.87–5.11)
RDW: 14.1 % (ref 11.5–15.5)
WBC: 6.7 10*3/uL (ref 4.0–10.5)
nRBC: 0 % (ref 0.0–0.2)

## 2019-06-29 LAB — URINALYSIS, ROUTINE W REFLEX MICROSCOPIC
Bilirubin Urine: NEGATIVE
Glucose, UA: NEGATIVE mg/dL
Hgb urine dipstick: NEGATIVE
Ketones, ur: NEGATIVE mg/dL
Nitrite: NEGATIVE
Protein, ur: NEGATIVE mg/dL
Specific Gravity, Urine: 1.006 (ref 1.005–1.030)
pH: 7 (ref 5.0–8.0)

## 2019-06-29 MED ORDER — SULFAMETHOXAZOLE-TRIMETHOPRIM 800-160 MG PO TABS
1.0000 | ORAL_TABLET | Freq: Once | ORAL | Status: AC
  Administered 2019-06-29: 1 via ORAL
  Filled 2019-06-29: qty 1

## 2019-06-29 MED ORDER — DIAZEPAM 5 MG/ML IJ SOLN
2.5000 mg | Freq: Once | INTRAMUSCULAR | Status: AC
  Administered 2019-06-29: 2.5 mg via INTRAVENOUS
  Filled 2019-06-29: qty 2

## 2019-06-29 MED ORDER — MECLIZINE HCL 25 MG PO TABS: 25.0000 mg | ORAL_TABLET | Freq: Three times a day (TID) | ORAL | 0 refills | Status: AC | PRN

## 2019-06-29 MED ORDER — SODIUM CHLORIDE 0.9% FLUSH
3.0000 mL | Freq: Once | INTRAVENOUS | Status: AC
  Administered 2019-06-29: 3 mL via INTRAVENOUS

## 2019-06-29 MED ORDER — PROCHLORPERAZINE EDISYLATE 10 MG/2ML IJ SOLN
5.0000 mg | Freq: Once | INTRAMUSCULAR | Status: AC
  Administered 2019-06-29: 5 mg via INTRAVENOUS
  Filled 2019-06-29: qty 2

## 2019-06-29 MED ORDER — DIPHENHYDRAMINE HCL 50 MG/ML IJ SOLN
12.5000 mg | Freq: Once | INTRAMUSCULAR | Status: AC
  Administered 2019-06-29: 12.5 mg via INTRAVENOUS
  Filled 2019-06-29: qty 1

## 2019-06-29 MED ORDER — SODIUM CHLORIDE 0.9 % IV BOLUS
1000.0000 mL | Freq: Once | INTRAVENOUS | Status: AC
  Administered 2019-06-29: 1000 mL via INTRAVENOUS

## 2019-06-29 MED ORDER — SULFAMETHOXAZOLE-TRIMETHOPRIM 800-160 MG PO TABS: 1.0000 | ORAL_TABLET | Freq: Two times a day (BID) | ORAL | 0 refills | Status: AC

## 2019-06-29 MED ORDER — MECLIZINE HCL 25 MG PO TABS
25.0000 mg | ORAL_TABLET | Freq: Once | ORAL | Status: AC
  Administered 2019-06-29: 25 mg via ORAL
  Filled 2019-06-29: qty 1

## 2019-06-29 NOTE — ED Notes (Signed)
Patient transported to MRI 

## 2019-06-29 NOTE — Discharge Instructions (Addendum)
You were diagnosed with vertigo,  as well as a urinary tract infection.  Please take your full course of antibiotics for the infection. I prescribed a medication called Antivert which should help with your vertigo symptoms.  Please take your time getting up and moving around the house at home.  Please keep yourself hydrated with fluids.  Follow-up with your primary care doctor in 1 week.

## 2019-06-29 NOTE — ED Notes (Signed)
Pt ambulated to bathroom 

## 2019-06-29 NOTE — ED Provider Notes (Addendum)
[facility] EMERGENCY DEPARTMENT Provider Note   CSN: [id number] Arrival date & time: 06/29/19  1049     History   Chief Complaint Chief Complaint  Patient presents with   Dizziness   Nausea    HPI [name] is a 85 y.o. female.     85 yo F with a chief complaint of dizziness.  Describes this as a sensation that the room is spinning.  Started yesterday morning when she was lying in bed she rolled over from her left side to her right and suddenly felt like the world was spinning.  She rolled back to her left and close her eyes and symptoms improved.  However every time she moves her head it occurred again.  She denies headache denies neck pain denies vomiting.  Has had some nausea with this.  Denies unilateral numbness or weakness denies difficulty with speech or swallowing.  She feels an sensation that something is different on the right side of her head than the left.  Has difficulty describing this.  No history of stroke.  No chest pain or shortness of breath.  She went to urgent care and then was sent here for evaluation.  The history is provided by the patient.  Dizziness Quality:  Head spinning Severity:  Severe Onset quality:  Gradual Duration:  2 days Timing:  Constant Progression:  Worsening Chronicity:  Recurrent Context: head movement   Relieved by:  Being still and closing eyes Worsened by:  Turning head, movement and eye movement Ineffective treatments:  None tried Associated symptoms: no chest pain, no headaches, no nausea, no palpitations, no shortness of breath and no vomiting     Past Medical History:  Diagnosis Date   Arthritis    Bowel obstruction (HCC)    Constipation    GERD (gastroesophageal reflux disease)    History of blood transfusion    History of bronchitis    History of hiatal hernia    History of kidney stones    Hypertension    Pneumonia    Wears glasses     Patient Active Problem List   Diagnosis  Date Noted   Hepatic cyst 08/28/2015    Past Surgical History:  Procedure Laterality Date   ABDOMINAL HYSTERECTOMY     ABDOMINAL SURGERY     APPENDECTOMY     BREAST SURGERY     EYE SURGERY     cataract surgery bilat with lens implants   LAPAROSCOPIC HEPATECTOMY N/A 08/28/2015   Procedure: LAPAROSCOPIC PARTIAL HEPATECTOMY;  Surgeon: Stark Klein, MD;  Location: [facility];  Service: General;  Laterality: N/A;   OOPHORECTOMY       OB History   No obstetric history on file.      Home Medications    Prior to Admission medications   Medication Sig Start Date End Date Taking? Authorizing Provider  acetaminophen (TYLENOL) 500 MG tablet Take 2 tablets (1,000 mg total) by mouth every 6 (six) hours as needed for mild pain or moderate pain. 08/29/15   Stark Klein, MD  azithromycin (ZITHROMAX) 250 MG tablet Take 1 tablet (250 mg total) by mouth daily. Take first 2 tablets together, then 1 every day until finished. 08/08/16   Lysbeth Penner, FNP  benzonatate (TESSALON) 100 MG capsule Take 1 capsule (100 mg total) by mouth every 8 (eight) hours. 08/08/16   Lysbeth Penner, FNP  omeprazole (PRILOSEC) 20 MG capsule Take 20 mg by mouth daily. 08/11/15   [provider]  ondansetron (ZOFRAN) 4 MG tablet Take 4 mg by mouth every 12 (twelve) hours as needed for nausea or vomiting.    [provider]  ondansetron (ZOFRAN-ODT) 4 MG disintegrating tablet Take 1 tablet (4 mg total) by mouth every 6 (six) hours as needed for nausea. 08/29/15   Stark Klein, MD  oxyCODONE (OXY IR/ROXICODONE) 5 MG immediate release tablet Take 0.5-1 tablets (2.5-5 mg total) by mouth every 4 (four) hours as needed for moderate pain. 08/29/15   Stark Klein, MD  potassium chloride SA (K-DUR,KLOR-CON) 20 MEQ tablet Take 2 tablets (40 mEq total) by mouth 2 (two) times daily. 08/30/15   Stark Klein, MD  promethazine (PHENERGAN) 12.5 MG tablet Take 1 tablet (12.5 mg total) by mouth every 6 (six) hours as needed  for nausea or vomiting. 07/25/15   Joy, Shawn C, PA-C  sodium chloride (OCEAN) 0.65 % SOLN nasal spray Place 1 spray into both nostrils daily as needed (dry nasal passages).    [provider]  triamterene-hydrochlorothiazide (DYAZIDE) 37.5-25 MG capsule Take 1 capsule by mouth daily. 08/11/15   [provider]    Family History No family history on file.  Social History Social History   Tobacco Use   Smoking status: Never Smoker   Smokeless tobacco: Never Used  Substance Use Topics   Alcohol use: No   Drug use: No     Allergies   Keflex [cephalexin] and Demerol [meperidine]   Review of Systems Review of Systems  Constitutional: Negative for chills and fever.  HENT: Negative for congestion and rhinorrhea.   Eyes: Negative for redness and visual disturbance.  Respiratory: Negative for shortness of breath and wheezing.   Cardiovascular: Negative for chest pain and palpitations.  Gastrointestinal: Negative for nausea and vomiting.  Genitourinary: Negative for dysuria and urgency.  Musculoskeletal: Negative for arthralgias and myalgias.  Skin: Negative for pallor and wound.  Neurological: Positive for dizziness. Negative for headaches.     Physical Exam Updated Vital Signs BP (!) 155/75 (BP Location: Right Arm)    Pulse (!) 57    Temp 98.2 F (36.8 C) (Oral)    Resp 16    SpO2 99%   Physical Exam Vitals signs and nursing note reviewed.  Constitutional:      General: She is not in acute distress.    Appearance: She is well-developed. She is not diaphoretic.  HENT:     Head: Normocephalic and atraumatic.  Eyes:     Pupils: Pupils are equal, round, and reactive to light.  Neck:     Musculoskeletal: Normal range of motion and neck supple.  Cardiovascular:     Rate and Rhythm: Normal rate and regular rhythm.     Heart sounds: No murmur. No friction rub. No gallop.   Pulmonary:     Effort: Pulmonary effort is normal.     Breath sounds: No wheezing  or rales.  Abdominal:     General: There is no distension.     Palpations: Abdomen is soft.     Tenderness: There is no abdominal tenderness.  Musculoskeletal:        General: No tenderness.  Skin:    General: Skin is warm and dry.  Neurological:     Mental Status: She is alert and oriented to person, place, and time.     Cranial Nerves: Cranial nerves are intact.     Sensory: Sensation is intact.     Motor: Motor function is intact.     Coordination: Coordination  is intact.     Comments: Fast going rightward nystagmus  Psychiatric:        Behavior: Behavior normal.      ED Treatments / Results  Labs (all labs ordered are listed, but only abnormal results are displayed) Labs Reviewed  BASIC METABOLIC PANEL - Abnormal; Notable for the following components:      Result Value   Potassium 3.3 (*)    Glucose, Bld 104 (*)    All other components within normal limits  URINALYSIS, ROUTINE W REFLEX MICROSCOPIC - Abnormal; Notable for the following components:   Color, Urine STRAW (*)    APPearance HAZY (*)    Leukocytes,Ua LARGE (*)    Bacteria, UA FEW (*)    Non Squamous Epithelial 0-5 (*)    All other components within normal limits  URINE CULTURE  CBC    EKG EKG Interpretation  Date/Time:  Wednesday June 29 2019 11:03:53 EST Ventricular Rate:  67 PR Interval:  184 QRS Duration: 80 QT Interval:  406 QTC Calculation: 429 R Axis:   55 Text Interpretation: Normal sinus rhythm Normal ECG No significant change since last tracing Confirmed by Deno Etienne [phone]) on 06/29/2019 12:30:55 PM   Radiology No results found.  Procedures Procedures (including critical care time)   Risk and benefits discussed with patient, was put through the maneuver starting with her head to the left, changed positions once symptoms resolved.  Patient tolerated the procedure well without complication.  CPT code [id number] Medications Ordered in ED Medications  diazepam (VALIUM) injection 2.5  mg (has no administration in time range)  sodium chloride flush (NS) 0.9 % injection 3 mL (3 mLs Intravenous Given 06/29/19 1326)  prochlorperazine (COMPAZINE) injection 5 mg (5 mg Intravenous Given 06/29/19 1323)  diphenhydrAMINE (BENADRYL) injection 12.5 mg (12.5 mg Intravenous Given 06/29/19 1321)  sodium chloride 0.9 % bolus 1,000 mL (1,000 mLs Intravenous New Bag/Given 06/29/19 1325)  meclizine (ANTIVERT) tablet 25 mg (25 mg Oral Given 06/29/19 1324)     Initial Impression / Assessment and Plan / ED Course  I have reviewed the triage vital signs and the nursing notes.  Pertinent labs & imaging results that were available during my care of the patient were reviewed by me and considered in my medical decision making (see chart for details).  Clinical Course as of Jun 29 913  Wed Jun 29, 2019  1531 Pt signed out ot me by Dr. Tyrone Nine.  Briefly 85 yo female presenting with acute onset vertigo beginning yesterday.  Very positional symptoms.  Pending MRI for posterior stroke assessment.  Also pending meds, if significantly symptomatic after workup, may need admission.  Will reassess.   [MT]  1708 IMPRESSION: No evidence of recent infarction, intracranial hemorrhage, or mass   [MT]  1744 Pt feeling much ebtter and strongly wishes to go home.  Will have staff ambulate her, believe it is reaosnable ot discharge home if stable   [MT]  1840 Ambulated easily about Ed, will discharge   [MT]    Clinical Course User Index [MT] Trifan, Carola Rhine, MD       85 yo F with a chief complaints of dizziness.  This started yesterday.  The patient rolled over in bed and then felt suddenly very dizzy.  Lasted for a short period of time and resolved.  Worsened every time she moved her head.  Worse when she turns the left side of the right.  Has had some difficulty walking and actually had worsening today.  Went to urgent care and they sent her here for evaluation.  No neurologic deficits on my exam.  I attempted  the Epley maneuver without significant improvement of her symptoms.  We will treat her symptoms give a bolus of IV fluids and obtain an MRI of the brain.  Post Epley maneuver without improvement of her symptoms.  Awaiting MRI.  Signed out to Dr. Langston Masker, please see his note for further details of care in the ED.   The patients results and plan were reviewed and discussed.   Any x-rays performed were independently reviewed by myself.   Differential diagnosis were considered with the presenting HPI.  Medications  diazepam (VALIUM) injection 2.5 mg (has no administration in time range)  sodium chloride flush (NS) 0.9 % injection 3 mL (3 mLs Intravenous Given 06/29/19 1326)  prochlorperazine (COMPAZINE) injection 5 mg (5 mg Intravenous Given 06/29/19 1323)  diphenhydrAMINE (BENADRYL) injection 12.5 mg (12.5 mg Intravenous Given 06/29/19 1321)  sodium chloride 0.9 % bolus 1,000 mL (1,000 mLs Intravenous New Bag/Given 06/29/19 1325)  meclizine (ANTIVERT) tablet 25 mg (25 mg Oral Given 06/29/19 1324)    Vitals:   06/29/19 1053 06/29/19 1525  BP: (!) 149/67 (!) 155/75  Pulse: (!) 55 (!) 57  Resp: 15 16  Temp: 98.2 F (36.8 C)   TempSrc: Oral   SpO2: 96% 99%    Final diagnoses:  Peripheral vertigo involving right ear    Admission/ observation were discussed with the admitting physician, patient and/or family and they are comfortable with the plan.    Final Clinical Impressions(s) / ED Diagnoses   Final diagnoses:  Peripheral vertigo involving right ear    ED Discharge Orders    None       Deno Etienne, DO 06/29/19 [place], [place], DO 06/30/19 [phone]

## 2019-06-29 NOTE — ED Notes (Signed)
Pt stands on own ability. Walks around room without assistance. Reports light-headed feelings are much better than on arrival.

## 2019-06-29 NOTE — ED Notes (Signed)
Patient verbalizes understanding of discharge instructions. Opportunity for questioning and answers were provided. Armband removed by staff. Patient discharged from ED. Signature pad unavailable.  

## 2019-06-29 NOTE — ED Triage Notes (Addendum)
C/o dizziness and nausea since yesterday.  No arm drift noted.  Denies pain.  Reports history of dizziness a few years ago and unsure if it feels the same or not.  No neuro deficits noted on triage exam.

## 2019-07-01 LAB — URINE CULTURE
Culture: 60000 — AB
Special Requests: NORMAL

## 2019-07-02 ENCOUNTER — Telehealth: Payer: Self-pay | Admitting: Emergency Medicine

## 2019-07-02 NOTE — Telephone Encounter (Signed)
Post ED Visit - Positive Culture Follow-up  Culture report reviewed by antimicrobial stewardship pharmacist: [facility] Team []  Elenor Quinones, Pharm.D. []  Heide Guile, Pharm.D., BCPS AQ-ID []  Parks Neptune, Pharm.D., BCPS []  Alycia Rossetti, Pharm.D., BCPS []  [place], Pharm.D., BCPS, AAHIVP []  Legrand Como, Pharm.D., BCPS, AAHIVP []  Salome Arnt, PharmD, BCPS []  Johnnette Gourd, PharmD, BCPS []  Hughes Better, PharmD, BCPS [x]  Duanne Limerick, PharmD []  Laqueta Linden, PharmD, BCPS []  Albertina Parr, PharmD  [facility] []  Leodis Sias, PharmD []  Lindell Spar, PharmD []  Royetta Asal, PharmD []  Graylin Shiver, Rph []  Rema Fendt) Glennon Mac, PharmD []  Arlyn Dunning, PharmD []  Netta Cedars, PharmD []  Dia Sitter, PharmD []  Leone Haven, PharmD []  Gretta Arab, PharmD []  Theodis Shove, PharmD []  Peggyann Juba, PharmD []  Reuel Boom, PharmD   Positive urine culture Treated with Bactrim, organism sensitive to the same and no further patient follow-up is required at this time.  Sandi Raveling Bexley Mclester 07/02/2019, 4:25 PM

## 2019-08-27 DIAGNOSIS — I10 Essential (primary) hypertension: Secondary | ICD-10-CM | POA: Diagnosis not present

## 2019-08-27 DIAGNOSIS — H8110 Benign paroxysmal vertigo, unspecified ear: Secondary | ICD-10-CM | POA: Diagnosis not present

## 2019-09-12 DIAGNOSIS — E876 Hypokalemia: Secondary | ICD-10-CM | POA: Diagnosis not present

## 2019-09-12 DIAGNOSIS — J329 Chronic sinusitis, unspecified: Secondary | ICD-10-CM | POA: Diagnosis not present

## 2019-09-12 DIAGNOSIS — R42 Dizziness and giddiness: Secondary | ICD-10-CM | POA: Diagnosis not present

## 2019-09-12 DIAGNOSIS — K5900 Constipation, unspecified: Secondary | ICD-10-CM | POA: Diagnosis not present

## 2019-09-12 DIAGNOSIS — I10 Essential (primary) hypertension: Secondary | ICD-10-CM | POA: Diagnosis not present

## 2019-09-14 ENCOUNTER — Telehealth (HOSPITAL_COMMUNITY): Payer: Self-pay

## 2019-09-14 NOTE — Telephone Encounter (Signed)
The above patient or their representative was contacted and gave the following answers to these questions:         Do you have any of the following symptoms?    NO  Fever                    Cough                   Shortness of breath  Do  you have any of the following other symptoms?    muscle pain         vomiting,        diarrhea        rash         weakness        red eye        abdominal pain         bruising          bruising or bleeding              joint pain           severe headache    Have you been in contact with someone who was or has been sick in the past 2 weeks?  NO  Yes                 Unsure                         Unable to assess   Does the person that you were in contact with have any of the following symptoms?   Cough         shortness of breath           muscle pain         vomiting,            diarrhea            rash            weakness           fever            red eye           abdominal pain           bruising  or  bleeding                joint pain                severe headache                 COMMENTS OR ACTION PLAN FOR THIS PATIENT:        ALL QUESTIONS WERE ANSWERED/[facility] PATIENT HAS GOTTEN 2ND COVID SHOT/[facility]

## 2019-09-16 ENCOUNTER — Ambulatory Visit (HOSPITAL_COMMUNITY)
Admission: RE | Admit: 2019-09-16 | Discharge: 2019-09-16 | Disposition: A | Discharge status: Home / Self Care | Payer: Medicare Other | Source: Ambulatory Visit | Attending: Vascular Surgery | Admitting: Vascular Surgery

## 2019-09-16 ENCOUNTER — Other Ambulatory Visit: Payer: Self-pay

## 2019-09-16 ENCOUNTER — Other Ambulatory Visit (HOSPITAL_COMMUNITY): Payer: Self-pay | Admitting: Internal Medicine

## 2019-09-16 DIAGNOSIS — R42 Dizziness and giddiness: Secondary | ICD-10-CM | POA: Diagnosis not present

## 2019-09-21 DIAGNOSIS — J019 Acute sinusitis, unspecified: Secondary | ICD-10-CM | POA: Diagnosis not present

## 2019-09-21 DIAGNOSIS — R42 Dizziness and giddiness: Secondary | ICD-10-CM | POA: Diagnosis not present

## 2019-09-26 DIAGNOSIS — H832X9 Labyrinthine dysfunction, unspecified ear: Secondary | ICD-10-CM | POA: Diagnosis not present

## 2019-09-26 DIAGNOSIS — H8111 Benign paroxysmal vertigo, right ear: Secondary | ICD-10-CM | POA: Diagnosis not present

## 2019-10-04 DIAGNOSIS — H832X9 Labyrinthine dysfunction, unspecified ear: Secondary | ICD-10-CM | POA: Diagnosis not present

## 2019-10-04 DIAGNOSIS — H8111 Benign paroxysmal vertigo, right ear: Secondary | ICD-10-CM | POA: Diagnosis not present

## 2019-10-11 DIAGNOSIS — H832X9 Labyrinthine dysfunction, unspecified ear: Secondary | ICD-10-CM | POA: Diagnosis not present

## 2019-10-11 DIAGNOSIS — H8111 Benign paroxysmal vertigo, right ear: Secondary | ICD-10-CM | POA: Diagnosis not present

## 2019-10-13 DIAGNOSIS — H8111 Benign paroxysmal vertigo, right ear: Secondary | ICD-10-CM | POA: Diagnosis not present

## 2019-10-13 DIAGNOSIS — H832X9 Labyrinthine dysfunction, unspecified ear: Secondary | ICD-10-CM | POA: Diagnosis not present

## 2019-11-04 DIAGNOSIS — R42 Dizziness and giddiness: Secondary | ICD-10-CM | POA: Diagnosis not present

## 2020-02-21 DIAGNOSIS — L820 Inflamed seborrheic keratosis: Secondary | ICD-10-CM | POA: Diagnosis not present

## 2020-02-21 DIAGNOSIS — L400 Psoriasis vulgaris: Secondary | ICD-10-CM | POA: Diagnosis not present

## 2020-02-21 DIAGNOSIS — L821 Other seborrheic keratosis: Secondary | ICD-10-CM | POA: Diagnosis not present

## 2020-03-15 DIAGNOSIS — R739 Hyperglycemia, unspecified: Secondary | ICD-10-CM | POA: Diagnosis not present

## 2020-03-15 DIAGNOSIS — E781 Pure hyperglyceridemia: Secondary | ICD-10-CM | POA: Diagnosis not present

## 2020-03-15 DIAGNOSIS — E559 Vitamin D deficiency, unspecified: Secondary | ICD-10-CM | POA: Diagnosis not present

## 2020-03-22 DIAGNOSIS — I10 Essential (primary) hypertension: Secondary | ICD-10-CM | POA: Diagnosis not present

## 2020-03-22 DIAGNOSIS — E876 Hypokalemia: Secondary | ICD-10-CM | POA: Diagnosis not present

## 2020-03-22 DIAGNOSIS — F419 Anxiety disorder, unspecified: Secondary | ICD-10-CM | POA: Diagnosis not present

## 2020-03-22 DIAGNOSIS — K760 Fatty (change of) liver, not elsewhere classified: Secondary | ICD-10-CM | POA: Diagnosis not present

## 2020-03-22 DIAGNOSIS — R82998 Other abnormal findings in urine: Secondary | ICD-10-CM | POA: Diagnosis not present

## 2020-03-22 DIAGNOSIS — Z0000 Encounter for general adult medical examination without abnormal findings: Secondary | ICD-10-CM | POA: Diagnosis not present

## 2020-03-22 DIAGNOSIS — E559 Vitamin D deficiency, unspecified: Secondary | ICD-10-CM | POA: Diagnosis not present

## 2020-03-22 DIAGNOSIS — J449 Chronic obstructive pulmonary disease, unspecified: Secondary | ICD-10-CM | POA: Diagnosis not present

## 2020-03-22 DIAGNOSIS — E781 Pure hyperglyceridemia: Secondary | ICD-10-CM | POA: Diagnosis not present

## 2020-03-22 DIAGNOSIS — Z1212 Encounter for screening for malignant neoplasm of rectum: Secondary | ICD-10-CM | POA: Diagnosis not present

## 2020-03-22 DIAGNOSIS — K7689 Other specified diseases of liver: Secondary | ICD-10-CM | POA: Diagnosis not present

## 2020-03-22 DIAGNOSIS — M8580 Other specified disorders of bone density and structure, unspecified site: Secondary | ICD-10-CM | POA: Diagnosis not present

## 2020-03-22 DIAGNOSIS — M1990 Unspecified osteoarthritis, unspecified site: Secondary | ICD-10-CM | POA: Diagnosis not present

## 2020-03-22 DIAGNOSIS — K589 Irritable bowel syndrome without diarrhea: Secondary | ICD-10-CM | POA: Diagnosis not present

## 2020-04-21 DIAGNOSIS — Z23 Encounter for immunization: Secondary | ICD-10-CM | POA: Diagnosis not present

## 2020-05-19 ENCOUNTER — Ambulatory Visit: Payer: Medicare Other | Attending: Internal Medicine

## 2020-05-19 DIAGNOSIS — Z23 Encounter for immunization: Secondary | ICD-10-CM

## 2020-05-19 NOTE — Progress Notes (Signed)
   Covid-19 Vaccination Clinic  Name:  [name]    MRN: [id number] DOB: 03/06/1933  05/19/2020  [name] was observed post Covid-19 immunization for 15 minutes without incident. She was provided with Vaccine Information Sheet and instruction to access the V-Safe system.   [name] was instructed to call 911 with any severe reactions post vaccine: Marland Kitchen Difficulty breathing  . Swelling of face and throat  . A fast heartbeat  . A bad rash all over body  . Dizziness and weakness

## 2020-05-24 DIAGNOSIS — L400 Psoriasis vulgaris: Secondary | ICD-10-CM | POA: Diagnosis not present

## 2020-11-27 ENCOUNTER — Ambulatory Visit (INDEPENDENT_AMBULATORY_CARE_PROVIDER_SITE_OTHER): Payer: Medicare PPO | Admitting: Otolaryngology

## 2020-11-27 ENCOUNTER — Other Ambulatory Visit: Payer: Self-pay

## 2020-11-27 ENCOUNTER — Encounter (INDEPENDENT_AMBULATORY_CARE_PROVIDER_SITE_OTHER): Payer: Self-pay | Admitting: Otolaryngology

## 2020-11-27 VITALS — Temp 98.1°F

## 2020-11-27 DIAGNOSIS — J310 Chronic rhinitis: Secondary | ICD-10-CM

## 2020-11-27 DIAGNOSIS — Z87898 Personal history of other specified conditions: Secondary | ICD-10-CM | POA: Diagnosis not present

## 2020-11-27 DIAGNOSIS — J3489 Other specified disorders of nose and nasal sinuses: Secondary | ICD-10-CM

## 2020-11-27 NOTE — Progress Notes (Signed)
HPI: [name] is a 87 y.o. female who presents for evaluation of a chronic small intermittent sore inside the right nostril.  Is located in the superior anterior right nostril.  This comes and goes she has tried antibiotic ointment over-the-counter.  It is occasionally sore when she pushes on it externally. She also occasionally has a dull headache on the right side of her head.  She used to see Dr. Ernesto Rutherford on a regular basis and had previous septoplasty performed with Dr. Ernesto Rutherford a number of years ago. She has also had history of vertigo/dizziness.  This flares up occasionally and she takes meclizine that was prescribed to her by [facility] and she wanted a refill of this as well as Zofran.  She states that this only occurs every couple months.  Past Medical History:  Diagnosis Date  . Arthritis   . Bowel obstruction ([place])   . Constipation   . GERD (gastroesophageal reflux disease)   . History of blood transfusion   . History of bronchitis   . History of hiatal hernia   . History of kidney stones   . Hypertension   . Pneumonia   . Wears glasses    Past Surgical History:  Procedure Laterality Date  . ABDOMINAL HYSTERECTOMY    . ABDOMINAL SURGERY    . APPENDECTOMY    . BREAST SURGERY    . EYE SURGERY     cataract surgery bilat with lens implants  . LAPAROSCOPIC HEPATECTOMY N/A 08/28/2015   Procedure: LAPAROSCOPIC PARTIAL HEPATECTOMY;  Surgeon: Stark Klein, MD;  Location: [facility];  Service: General;  Laterality: N/A;  . OOPHORECTOMY     Social History   Socioeconomic History  . Marital status: Married    Spouse name: Not on file  . Number of children: Not on file  . Years of education: Not on file  . Highest education level: Not on file  Occupational History  . Not on file  Tobacco Use  . Smoking status: Never Smoker  . Smokeless tobacco: Never Used  Substance and Sexual Activity  . Alcohol use: No  . Drug use: No  . Sexual activity: Not on file  Other Topics  Concern  . Not on file  Social History Narrative  . Not on file   Social Determinants of Health   Financial Resource Strain: Not on file  Food Insecurity: Not on file  Transportation Needs: Not on file  Physical Activity: Not on file  Stress: Not on file  Social Connections: Not on file   No family history on file. Allergies  Allergen Reactions  . Keflex [Cephalexin] Nausea Only    Stomach cramps.  . Demerol [Meperidine] Rash   Prior to Admission medications   Medication Sig Start Date End Date Taking? Authorizing Provider  acetaminophen (TYLENOL) 500 MG tablet Take 2 tablets (1,000 mg total) by mouth every 6 (six) hours as needed for mild pain or moderate pain. 08/29/15   Stark Klein, MD  azithromycin (ZITHROMAX) 250 MG tablet Take 1 tablet (250 mg total) by mouth daily. Take first 2 tablets together, then 1 every day until finished. 08/08/16   Lysbeth Penner, FNP  benzonatate (TESSALON) 100 MG capsule Take 1 capsule (100 mg total) by mouth every 8 (eight) hours. 08/08/16   Lysbeth Penner, FNP  meclizine (ANTIVERT) 25 MG tablet Take 1 tablet (25 mg total) by mouth 3 (three) times daily as needed for up to 25 doses for dizziness or nausea. 06/29/19  Wyvonnia Dusky, MD  omeprazole (PRILOSEC) 20 MG capsule Take 20 mg by mouth daily. 08/11/15   [provider]  ondansetron (ZOFRAN) 4 MG tablet Take 4 mg by mouth every 12 (twelve) hours as needed for nausea or vomiting.    [provider]  ondansetron (ZOFRAN-ODT) 4 MG disintegrating tablet Take 1 tablet (4 mg total) by mouth every 6 (six) hours as needed for nausea. 08/29/15   Stark Klein, MD  oxyCODONE (OXY IR/ROXICODONE) 5 MG immediate release tablet Take 0.5-1 tablets (2.5-5 mg total) by mouth every 4 (four) hours as needed for moderate pain. 08/29/15   Stark Klein, MD  potassium chloride SA (K-DUR,KLOR-CON) 20 MEQ tablet Take 2 tablets (40 mEq total) by mouth 2 (two) times daily. 08/30/15   Stark Klein, MD   promethazine (PHENERGAN) 12.5 MG tablet Take 1 tablet (12.5 mg total) by mouth every 6 (six) hours as needed for nausea or vomiting. 07/25/15   Joy, Shawn C, PA-C  sodium chloride (OCEAN) 0.65 % SOLN nasal spray Place 1 spray into both nostrils daily as needed (dry nasal passages).    [provider]  triamterene-hydrochlorothiazide (DYAZIDE) 37.5-25 MG capsule Take 1 capsule by mouth daily. 08/11/15   [provider]     Positive ROS: Otherwise negative  All other systems have been reviewed and were otherwise negative with the exception of those mentioned in the HPI and as above.  Physical Exam: Constitutional: Alert, well-appearing, no acute distress Ears: External ears without lesions or tenderness. Ear canals are clear bilaterally with intact, clear TMs bilaterally.  Hearing screening with a tuning fork revealed symmetric hearing with reasonably good hearing in both ears. Nasal: External nose without lesions. Septum mildly deviated to the right.  On nasal endoscopy both middle meatus regions were clear bilaterally with no evidence of active infection.  She had mild rhinitis with clear mucus discharge.  On exam of the superior nostril where she feels like the sore is located this is normal to examination although she may have some dried mucus but no ulceration or abnormality noted.  The nasopharynx was clear.. Oral: Lips and gums without lesions. Tongue and palate mucosa without lesions. Posterior oropharynx clear.  She is status post tonsillectomy. Neck: No palpable adenopathy or masses Respiratory: Breathing comfortably  Skin: No facial/neck lesions or rash noted.  Procedures  Assessment: Right sided superior nostril nasal sore. Chronic rhinitis. History of dizziness.  Plan: Prescribed Flonase 2 sprays each nostril to use at night to help with the headaches that she feels like are "sinus" related.  She already uses Flonase at night and saline rinse in the  morning. Concerning the nasal sore recommended application of mupirocin 2% ointment twice daily for 5days as needed.  Reassured her of no evidence of neoplasm or growth. Refilled her meclizine 25 mg 1/2-1 tab every 8 hours as needed dizziness #30.  Also Zofran 4 mg tablets 1 every 8 hours as needed nausea #20. She will follow-up as needed.  Radene Journey, MD

## 2021-06-24 DIAGNOSIS — Z1231 Encounter for screening mammogram for malignant neoplasm of breast: Secondary | ICD-10-CM | POA: Diagnosis not present

## 2021-07-24 ENCOUNTER — Other Ambulatory Visit: Payer: Self-pay

## 2021-07-24 ENCOUNTER — Ambulatory Visit (INDEPENDENT_AMBULATORY_CARE_PROVIDER_SITE_OTHER): Payer: Medicare PPO

## 2021-07-24 ENCOUNTER — Encounter: Payer: Self-pay | Admitting: Orthopaedic Surgery

## 2021-07-24 ENCOUNTER — Ambulatory Visit: Payer: Medicare PPO | Admitting: Orthopaedic Surgery

## 2021-07-24 DIAGNOSIS — G8929 Other chronic pain: Secondary | ICD-10-CM

## 2021-07-24 DIAGNOSIS — M25511 Pain in right shoulder: Secondary | ICD-10-CM

## 2021-07-24 MED ORDER — BUPIVACAINE HCL 0.25 % IJ SOLN
2.0000 mL | INTRAMUSCULAR | Status: AC | PRN
  Administered 2021-07-24: 2 mL via INTRA_ARTICULAR

## 2021-07-24 MED ORDER — METHYLPREDNISOLONE ACETATE 40 MG/ML IJ SUSP
40.0000 mg | INTRAMUSCULAR | Status: AC | PRN
  Administered 2021-07-24: 40 mg via INTRA_ARTICULAR

## 2021-07-24 MED ORDER — LIDOCAINE HCL 1 % IJ SOLN
2.0000 mL | INTRAMUSCULAR | Status: AC | PRN
  Administered 2021-07-24: 2 mL

## 2021-07-24 NOTE — Progress Notes (Signed)
Office Visit Note   Patient: [name]           Date of Birth: 30-May-1933           MRN: [id number] Visit Date: 07/24/2021              Requested by: Shon Baton, [address],  [place] [address] PCP: Shon Baton, MD   Assessment & Plan: Visit Diagnoses:  1. Chronic right shoulder pain   2. Acute pain of right shoulder     Plan: [name] experienced insidious onset of right shoulder pain approximately week ago that she thinks may be related to repetitive activity over the Christmas holidays.  She awoke 1 morning with inability to raise her arm overhead.  The pain continues to the point of compromise.  She has not had any neck pain or numbness or tingling referred to her right upper extremity.  She has tried some Tylenol but notes that she still having difficulty with her motion and pain.  She does have positive impingement and difficulty with overhead motion.  Films are negative for any acute changes but she might have very early degenerative change in the glenohumeral joint.  There is some prominence of the anterior lateral acromion which would predispose her to impingement.  There is always a possibility of a small rotator cuff tear but she does have good strength.  I elected to inject the glenohumeral joint and the subacromial region with Depo-Medrol Xylocaine and Marcaine and she had considerable increased motion and ability to raise her arm over her head after the injection.  We will monitor her course and have her check back in the next several weeks if she does not have any prolonged relief.  Follow-Up Instructions: Return if symptoms worsen or fail to improve.   Orders:  Orders Placed This Encounter  Procedures   XR Shoulder Right   No orders of the defined types were placed in this encounter.     Procedures: Large Joint Inj: R glenohumeral on 07/24/2021 1:51 PM Indications: pain and diagnostic evaluation Details: 25 G 1.5 in needle, posterior  approach  Arthrogram: No  Medications: 2 mL lidocaine 1 %; 40 mg methylPREDNISolone acetate 40 MG/ML; 2 mL bupivacaine 0.25 % Consent was given by the patient. Immediately prior to procedure a time out was called to verify the correct patient, procedure, equipment, support staff and site/side marked as required. Patient was prepped and draped in the usual sterile fashion.      Clinical Data: No additional findings.   Subjective: Chief Complaint  Patient presents with   Right Shoulder - Pain  Patient presents today for right shoulder pain. She said that it started with just a "twinge" over a week ago. No known injury. Decreased range of motion. Anterior pain. She is left hand dominant. She is taking Tylenol for pain relief.  Denies any neck pain.  Not having any numbness or tingling to the right upper extremity.  Denies a specific injury or trauma  HPI  Review of Systems   Objective: Vital Signs: There were no vitals taken for this visit.  Physical Exam Constitutional:      Appearance: She is well-developed.  Pulmonary:     Effort: Pulmonary effort is normal.  Skin:    General: Skin is warm and dry.  Neurological:     Mental Status: She is alert and oriented to person, place, and time.  Psychiatric:        Behavior:  Behavior normal.    Ortho Exam painless range of motion cervical spine without any referred discomfort to either upper extremity.  Has significant limitation of motion of right shoulder secondary to pain.  I could slowly raise her arm overhead but it was quite circuitous and uncomfortable.  Positive impingement and empty can testing.  Minimal discomfort at the [facility] joint.  Some mild discomfort at the the anterior lateral subacromial region but no crepitation.  Appears to have good strength.  Good grip and release.  Neurologically intact.  Biceps intact.  Specialty Comments:  No specialty comments available.  Imaging: XR Shoulder Right  Result Date:  07/24/2021 Films of the right shoulder taken in 4 projections.  There is a very small inferior humeral head spur but no narrowing of the glenohumeral joint space.  Normal space between the humeral head and the acromion.  There is a projection from the lateral aspect of the acromion and some prominence of the inferior anterior chromium which would predispose to impingement.  Some mild degenerative changes at the [facility] joint.  No acute changes or ectopic calcification    PMFS History: Patient Active Problem List   Diagnosis Date Noted   Pain in right shoulder 07/24/2021   Hepatic cyst 08/28/2015   Past Medical History:  Diagnosis Date   Arthritis    Bowel obstruction ([facility])    Constipation    GERD (gastroesophageal reflux disease)    History of blood transfusion    History of bronchitis    History of hiatal hernia    History of kidney stones    Hypertension    Pneumonia    Wears glasses     History reviewed. No pertinent family history.  Past Surgical History:  Procedure Laterality Date   ABDOMINAL HYSTERECTOMY     ABDOMINAL SURGERY     APPENDECTOMY     BREAST SURGERY     EYE SURGERY     cataract surgery bilat with lens implants   LAPAROSCOPIC HEPATECTOMY N/A 08/28/2015   Procedure: LAPAROSCOPIC PARTIAL HEPATECTOMY;  Surgeon: Stark Klein, MD;  Location: [facility];  Service: General;  Laterality: N/A;   OOPHORECTOMY     Social History   Occupational History   Not on file  Tobacco Use   Smoking status: Never   Smokeless tobacco: Never  Substance and Sexual Activity   Alcohol use: No   Drug use: No   Sexual activity: Not on file

## 2021-08-19 DIAGNOSIS — N390 Urinary tract infection, site not specified: Secondary | ICD-10-CM | POA: Diagnosis not present

## 2021-08-19 DIAGNOSIS — R300 Dysuria: Secondary | ICD-10-CM | POA: Diagnosis not present

## 2021-09-02 DIAGNOSIS — H401211 Low-tension glaucoma, right eye, mild stage: Secondary | ICD-10-CM | POA: Diagnosis not present

## 2021-09-02 DIAGNOSIS — H401222 Low-tension glaucoma, left eye, moderate stage: Secondary | ICD-10-CM | POA: Diagnosis not present

## 2021-09-02 DIAGNOSIS — H353131 Nonexudative age-related macular degeneration, bilateral, early dry stage: Secondary | ICD-10-CM | POA: Diagnosis not present

## 2021-09-18 DIAGNOSIS — H353132 Nonexudative age-related macular degeneration, bilateral, intermediate dry stage: Secondary | ICD-10-CM | POA: Diagnosis not present

## 2021-09-18 DIAGNOSIS — H401211 Low-tension glaucoma, right eye, mild stage: Secondary | ICD-10-CM | POA: Diagnosis not present

## 2021-09-18 DIAGNOSIS — H401222 Low-tension glaucoma, left eye, moderate stage: Secondary | ICD-10-CM | POA: Diagnosis not present

## 2021-09-18 DIAGNOSIS — H52203 Unspecified astigmatism, bilateral: Secondary | ICD-10-CM | POA: Diagnosis not present

## 2021-10-08 DIAGNOSIS — H401222 Low-tension glaucoma, left eye, moderate stage: Secondary | ICD-10-CM | POA: Diagnosis not present

## 2021-11-12 DIAGNOSIS — H401211 Low-tension glaucoma, right eye, mild stage: Secondary | ICD-10-CM | POA: Diagnosis not present

## 2021-12-20 DIAGNOSIS — H401222 Low-tension glaucoma, left eye, moderate stage: Secondary | ICD-10-CM | POA: Diagnosis not present

## 2021-12-20 DIAGNOSIS — H401211 Low-tension glaucoma, right eye, mild stage: Secondary | ICD-10-CM | POA: Diagnosis not present

## 2022-01-10 DIAGNOSIS — J0100 Acute maxillary sinusitis, unspecified: Secondary | ICD-10-CM | POA: Diagnosis not present

## 2022-01-10 DIAGNOSIS — H8110 Benign paroxysmal vertigo, unspecified ear: Secondary | ICD-10-CM | POA: Diagnosis not present

## 2022-02-21 DIAGNOSIS — Z823 Family history of stroke: Secondary | ICD-10-CM | POA: Diagnosis not present

## 2022-02-21 DIAGNOSIS — H409 Unspecified glaucoma: Secondary | ICD-10-CM | POA: Diagnosis not present

## 2022-02-21 DIAGNOSIS — R32 Unspecified urinary incontinence: Secondary | ICD-10-CM | POA: Diagnosis not present

## 2022-02-21 DIAGNOSIS — G8929 Other chronic pain: Secondary | ICD-10-CM | POA: Diagnosis not present

## 2022-02-21 DIAGNOSIS — R42 Dizziness and giddiness: Secondary | ICD-10-CM | POA: Diagnosis not present

## 2022-02-21 DIAGNOSIS — Z811 Family history of alcohol abuse and dependence: Secondary | ICD-10-CM | POA: Diagnosis not present

## 2022-02-21 DIAGNOSIS — I10 Essential (primary) hypertension: Secondary | ICD-10-CM | POA: Diagnosis not present

## 2022-02-21 DIAGNOSIS — Z809 Family history of malignant neoplasm, unspecified: Secondary | ICD-10-CM | POA: Diagnosis not present

## 2022-02-21 DIAGNOSIS — K219 Gastro-esophageal reflux disease without esophagitis: Secondary | ICD-10-CM | POA: Diagnosis not present

## 2022-02-26 ENCOUNTER — Other Ambulatory Visit: Payer: Self-pay | Admitting: Gastroenterology

## 2022-02-26 DIAGNOSIS — K5900 Constipation, unspecified: Secondary | ICD-10-CM

## 2022-02-26 DIAGNOSIS — K219 Gastro-esophageal reflux disease without esophagitis: Secondary | ICD-10-CM | POA: Diagnosis not present

## 2022-02-26 DIAGNOSIS — R1031 Right lower quadrant pain: Secondary | ICD-10-CM | POA: Diagnosis not present

## 2022-02-26 DIAGNOSIS — R1030 Lower abdominal pain, unspecified: Secondary | ICD-10-CM | POA: Diagnosis not present

## 2022-03-27 ENCOUNTER — Ambulatory Visit
Admission: RE | Admit: 2022-03-27 | Discharge: 2022-03-27 | Disposition: A | Discharge status: Home / Self Care | Payer: Medicare PPO | Source: Ambulatory Visit | Attending: Gastroenterology | Admitting: Gastroenterology

## 2022-03-27 DIAGNOSIS — N8189 Other female genital prolapse: Secondary | ICD-10-CM | POA: Diagnosis not present

## 2022-03-27 DIAGNOSIS — R1030 Lower abdominal pain, unspecified: Secondary | ICD-10-CM

## 2022-03-27 DIAGNOSIS — N201 Calculus of ureter: Secondary | ICD-10-CM | POA: Diagnosis not present

## 2022-03-27 DIAGNOSIS — K5900 Constipation, unspecified: Secondary | ICD-10-CM | POA: Diagnosis not present

## 2022-03-27 DIAGNOSIS — N200 Calculus of kidney: Secondary | ICD-10-CM | POA: Diagnosis not present

## 2022-03-27 MED ORDER — IOPAMIDOL (ISOVUE-300) INJECTION 61%
100.0000 mL | Freq: Once | INTRAVENOUS | Status: AC | PRN
  Administered 2022-03-27: 100 mL via INTRAVENOUS

## 2022-04-08 DIAGNOSIS — F419 Anxiety disorder, unspecified: Secondary | ICD-10-CM | POA: Diagnosis not present

## 2022-04-08 DIAGNOSIS — R739 Hyperglycemia, unspecified: Secondary | ICD-10-CM | POA: Diagnosis not present

## 2022-04-08 DIAGNOSIS — E559 Vitamin D deficiency, unspecified: Secondary | ICD-10-CM | POA: Diagnosis not present

## 2022-04-08 DIAGNOSIS — R7989 Other specified abnormal findings of blood chemistry: Secondary | ICD-10-CM | POA: Diagnosis not present

## 2022-04-08 DIAGNOSIS — I10 Essential (primary) hypertension: Secondary | ICD-10-CM | POA: Diagnosis not present

## 2022-04-15 DIAGNOSIS — I10 Essential (primary) hypertension: Secondary | ICD-10-CM | POA: Diagnosis not present

## 2022-04-15 DIAGNOSIS — R739 Hyperglycemia, unspecified: Secondary | ICD-10-CM | POA: Diagnosis not present

## 2022-04-15 DIAGNOSIS — M8580 Other specified disorders of bone density and structure, unspecified site: Secondary | ICD-10-CM | POA: Diagnosis not present

## 2022-04-15 DIAGNOSIS — Z0000 Encounter for general adult medical examination without abnormal findings: Secondary | ICD-10-CM | POA: Diagnosis not present

## 2022-04-15 DIAGNOSIS — M1990 Unspecified osteoarthritis, unspecified site: Secondary | ICD-10-CM | POA: Diagnosis not present

## 2022-04-15 DIAGNOSIS — E781 Pure hyperglyceridemia: Secondary | ICD-10-CM | POA: Diagnosis not present

## 2022-04-15 DIAGNOSIS — E559 Vitamin D deficiency, unspecified: Secondary | ICD-10-CM | POA: Diagnosis not present

## 2022-04-15 DIAGNOSIS — K5792 Diverticulitis of intestine, part unspecified, without perforation or abscess without bleeding: Secondary | ICD-10-CM | POA: Diagnosis not present

## 2022-04-15 DIAGNOSIS — R82998 Other abnormal findings in urine: Secondary | ICD-10-CM | POA: Diagnosis not present

## 2022-04-15 DIAGNOSIS — K7689 Other specified diseases of liver: Secondary | ICD-10-CM | POA: Diagnosis not present

## 2022-04-17 DIAGNOSIS — K219 Gastro-esophageal reflux disease without esophagitis: Secondary | ICD-10-CM | POA: Diagnosis not present

## 2022-04-17 DIAGNOSIS — R197 Diarrhea, unspecified: Secondary | ICD-10-CM | POA: Diagnosis not present

## 2022-04-17 DIAGNOSIS — Z8719 Personal history of other diseases of the digestive system: Secondary | ICD-10-CM | POA: Diagnosis not present

## 2022-04-17 DIAGNOSIS — R1084 Generalized abdominal pain: Secondary | ICD-10-CM | POA: Diagnosis not present

## 2022-05-05 DIAGNOSIS — H401222 Low-tension glaucoma, left eye, moderate stage: Secondary | ICD-10-CM | POA: Diagnosis not present

## 2022-06-02 DIAGNOSIS — H401222 Low-tension glaucoma, left eye, moderate stage: Secondary | ICD-10-CM | POA: Diagnosis not present

## 2022-06-03 DIAGNOSIS — R110 Nausea: Secondary | ICD-10-CM | POA: Diagnosis not present

## 2022-06-03 DIAGNOSIS — K5900 Constipation, unspecified: Secondary | ICD-10-CM | POA: Diagnosis not present

## 2022-06-03 DIAGNOSIS — K219 Gastro-esophageal reflux disease without esophagitis: Secondary | ICD-10-CM | POA: Diagnosis not present

## 2022-06-26 DIAGNOSIS — Z1231 Encounter for screening mammogram for malignant neoplasm of breast: Secondary | ICD-10-CM | POA: Diagnosis not present

## 2022-07-09 DIAGNOSIS — H401222 Low-tension glaucoma, left eye, moderate stage: Secondary | ICD-10-CM | POA: Diagnosis not present

## 2022-11-04 DIAGNOSIS — H401222 Low-tension glaucoma, left eye, moderate stage: Secondary | ICD-10-CM | POA: Diagnosis not present

## 2022-11-04 DIAGNOSIS — H524 Presbyopia: Secondary | ICD-10-CM | POA: Diagnosis not present

## 2022-11-04 DIAGNOSIS — H353132 Nonexudative age-related macular degeneration, bilateral, intermediate dry stage: Secondary | ICD-10-CM | POA: Diagnosis not present

## 2022-11-04 DIAGNOSIS — H43813 Vitreous degeneration, bilateral: Secondary | ICD-10-CM | POA: Diagnosis not present

## 2022-11-04 DIAGNOSIS — H26493 Other secondary cataract, bilateral: Secondary | ICD-10-CM | POA: Diagnosis not present

## 2022-11-04 DIAGNOSIS — H5203 Hypermetropia, bilateral: Secondary | ICD-10-CM | POA: Diagnosis not present

## 2023-02-18 DIAGNOSIS — L400 Psoriasis vulgaris: Secondary | ICD-10-CM | POA: Diagnosis not present

## 2023-02-18 DIAGNOSIS — L821 Other seborrheic keratosis: Secondary | ICD-10-CM | POA: Diagnosis not present

## 2023-03-11 DIAGNOSIS — H401222 Low-tension glaucoma, left eye, moderate stage: Secondary | ICD-10-CM | POA: Diagnosis not present

## 2023-03-11 DIAGNOSIS — H04123 Dry eye syndrome of bilateral lacrimal glands: Secondary | ICD-10-CM | POA: Diagnosis not present

## 2023-04-20 DIAGNOSIS — L821 Other seborrheic keratosis: Secondary | ICD-10-CM | POA: Diagnosis not present

## 2023-04-20 DIAGNOSIS — L218 Other seborrheic dermatitis: Secondary | ICD-10-CM | POA: Diagnosis not present

## 2023-04-20 DIAGNOSIS — D1801 Hemangioma of skin and subcutaneous tissue: Secondary | ICD-10-CM | POA: Diagnosis not present

## 2023-04-20 DIAGNOSIS — L814 Other melanin hyperpigmentation: Secondary | ICD-10-CM | POA: Diagnosis not present

## 2023-04-21 DIAGNOSIS — I10 Essential (primary) hypertension: Secondary | ICD-10-CM | POA: Diagnosis not present

## 2023-04-21 DIAGNOSIS — E559 Vitamin D deficiency, unspecified: Secondary | ICD-10-CM | POA: Diagnosis not present

## 2023-04-21 DIAGNOSIS — R739 Hyperglycemia, unspecified: Secondary | ICD-10-CM | POA: Diagnosis not present

## 2023-04-21 DIAGNOSIS — E781 Pure hyperglyceridemia: Secondary | ICD-10-CM | POA: Diagnosis not present

## 2023-04-21 DIAGNOSIS — M8580 Other specified disorders of bone density and structure, unspecified site: Secondary | ICD-10-CM | POA: Diagnosis not present

## 2023-04-28 DIAGNOSIS — M1990 Unspecified osteoarthritis, unspecified site: Secondary | ICD-10-CM | POA: Diagnosis not present

## 2023-04-28 DIAGNOSIS — R159 Full incontinence of feces: Secondary | ICD-10-CM | POA: Diagnosis not present

## 2023-04-28 DIAGNOSIS — I700 Atherosclerosis of aorta: Secondary | ICD-10-CM | POA: Diagnosis not present

## 2023-04-28 DIAGNOSIS — Z0000 Encounter for general adult medical examination without abnormal findings: Secondary | ICD-10-CM | POA: Diagnosis not present

## 2023-04-28 DIAGNOSIS — K588 Other irritable bowel syndrome: Secondary | ICD-10-CM | POA: Diagnosis not present

## 2023-04-28 DIAGNOSIS — G4700 Insomnia, unspecified: Secondary | ICD-10-CM | POA: Diagnosis not present

## 2023-04-28 DIAGNOSIS — I10 Essential (primary) hypertension: Secondary | ICD-10-CM | POA: Diagnosis not present

## 2023-04-28 DIAGNOSIS — E781 Pure hyperglyceridemia: Secondary | ICD-10-CM | POA: Diagnosis not present

## 2023-04-28 DIAGNOSIS — R82998 Other abnormal findings in urine: Secondary | ICD-10-CM | POA: Diagnosis not present

## 2023-04-28 DIAGNOSIS — Z23 Encounter for immunization: Secondary | ICD-10-CM | POA: Diagnosis not present

## 2023-04-28 DIAGNOSIS — M8580 Other specified disorders of bone density and structure, unspecified site: Secondary | ICD-10-CM | POA: Diagnosis not present

## 2023-07-02 DIAGNOSIS — Z1231 Encounter for screening mammogram for malignant neoplasm of breast: Secondary | ICD-10-CM | POA: Diagnosis not present

## 2023-07-08 DIAGNOSIS — H353132 Nonexudative age-related macular degeneration, bilateral, intermediate dry stage: Secondary | ICD-10-CM | POA: Diagnosis not present

## 2023-07-08 DIAGNOSIS — H401222 Low-tension glaucoma, left eye, moderate stage: Secondary | ICD-10-CM | POA: Diagnosis not present

## 2023-07-08 DIAGNOSIS — H04123 Dry eye syndrome of bilateral lacrimal glands: Secondary | ICD-10-CM | POA: Diagnosis not present

## 2023-07-23 DIAGNOSIS — S6992XA Unspecified injury of left wrist, hand and finger(s), initial encounter: Secondary | ICD-10-CM | POA: Diagnosis not present

## 2023-09-07 DIAGNOSIS — Z2089 Contact with and (suspected) exposure to other communicable diseases: Secondary | ICD-10-CM | POA: Diagnosis not present

## 2023-09-07 DIAGNOSIS — R051 Acute cough: Secondary | ICD-10-CM | POA: Diagnosis not present

## 2023-09-07 DIAGNOSIS — R5383 Other fatigue: Secondary | ICD-10-CM | POA: Diagnosis not present

## 2023-10-19 DIAGNOSIS — L218 Other seborrheic dermatitis: Secondary | ICD-10-CM | POA: Diagnosis not present

## 2023-11-12 DIAGNOSIS — H43813 Vitreous degeneration, bilateral: Secondary | ICD-10-CM | POA: Diagnosis not present

## 2023-11-12 DIAGNOSIS — H353132 Nonexudative age-related macular degeneration, bilateral, intermediate dry stage: Secondary | ICD-10-CM | POA: Diagnosis not present

## 2023-11-12 DIAGNOSIS — H04123 Dry eye syndrome of bilateral lacrimal glands: Secondary | ICD-10-CM | POA: Diagnosis not present

## 2023-11-12 DIAGNOSIS — H52201 Unspecified astigmatism, right eye: Secondary | ICD-10-CM | POA: Diagnosis not present

## 2023-11-12 DIAGNOSIS — H401222 Low-tension glaucoma, left eye, moderate stage: Secondary | ICD-10-CM | POA: Diagnosis not present

## 2023-11-12 DIAGNOSIS — H26493 Other secondary cataract, bilateral: Secondary | ICD-10-CM | POA: Diagnosis not present

## 2023-11-12 DIAGNOSIS — H524 Presbyopia: Secondary | ICD-10-CM | POA: Diagnosis not present

## 2024-03-11 DIAGNOSIS — H401222 Low-tension glaucoma, left eye, moderate stage: Secondary | ICD-10-CM | POA: Diagnosis not present

## 2024-03-15 DIAGNOSIS — H401222 Low-tension glaucoma, left eye, moderate stage: Secondary | ICD-10-CM | POA: Diagnosis not present

## 2024-04-05 DIAGNOSIS — H401212 Low-tension glaucoma, right eye, moderate stage: Secondary | ICD-10-CM | POA: Diagnosis not present

## 2024-05-06 DIAGNOSIS — I10 Essential (primary) hypertension: Secondary | ICD-10-CM | POA: Diagnosis not present

## 2024-05-06 DIAGNOSIS — Z1389 Encounter for screening for other disorder: Secondary | ICD-10-CM | POA: Diagnosis not present

## 2024-05-06 DIAGNOSIS — R739 Hyperglycemia, unspecified: Secondary | ICD-10-CM | POA: Diagnosis not present

## 2024-05-06 DIAGNOSIS — E781 Pure hyperglyceridemia: Secondary | ICD-10-CM | POA: Diagnosis not present

## 2024-05-06 DIAGNOSIS — E559 Vitamin D deficiency, unspecified: Secondary | ICD-10-CM | POA: Diagnosis not present

## 2024-05-06 DIAGNOSIS — K219 Gastro-esophageal reflux disease without esophagitis: Secondary | ICD-10-CM | POA: Diagnosis not present

## 2024-05-13 DIAGNOSIS — R82998 Other abnormal findings in urine: Secondary | ICD-10-CM | POA: Diagnosis not present

## 2024-05-13 DIAGNOSIS — L409 Psoriasis, unspecified: Secondary | ICD-10-CM | POA: Diagnosis not present

## 2024-05-13 DIAGNOSIS — J449 Chronic obstructive pulmonary disease, unspecified: Secondary | ICD-10-CM | POA: Diagnosis not present

## 2024-05-13 DIAGNOSIS — Z0000 Encounter for general adult medical examination without abnormal findings: Secondary | ICD-10-CM | POA: Diagnosis not present

## 2024-05-13 DIAGNOSIS — R1031 Right lower quadrant pain: Secondary | ICD-10-CM | POA: Diagnosis not present

## 2024-05-13 DIAGNOSIS — I10 Essential (primary) hypertension: Secondary | ICD-10-CM | POA: Diagnosis not present

## 2024-05-13 DIAGNOSIS — F419 Anxiety disorder, unspecified: Secondary | ICD-10-CM | POA: Diagnosis not present

## 2024-05-13 DIAGNOSIS — E781 Pure hyperglyceridemia: Secondary | ICD-10-CM | POA: Diagnosis not present

## 2024-05-13 DIAGNOSIS — K588 Other irritable bowel syndrome: Secondary | ICD-10-CM | POA: Diagnosis not present

## 2024-06-09 DIAGNOSIS — H401223 Low-tension glaucoma, left eye, severe stage: Secondary | ICD-10-CM | POA: Diagnosis not present
